# Patient Record
Sex: Female | Born: 1960
Health system: Southern US, Community
[De-identification: ages and names within clinical notes are randomized; demographics above are authoritative.]

## PROBLEM LIST (undated history)

## (undated) DIAGNOSIS — F419 Anxiety disorder, unspecified: Secondary | ICD-10-CM

## (undated) DIAGNOSIS — K219 Gastro-esophageal reflux disease without esophagitis: Secondary | ICD-10-CM

## (undated) DIAGNOSIS — K8020 Calculus of gallbladder without cholecystitis without obstruction: Secondary | ICD-10-CM

## (undated) DIAGNOSIS — T7840XA Allergy, unspecified, initial encounter: Secondary | ICD-10-CM

## (undated) DIAGNOSIS — J302 Other seasonal allergic rhinitis: Secondary | ICD-10-CM

## (undated) DIAGNOSIS — H3530 Unspecified macular degeneration: Secondary | ICD-10-CM

## (undated) DIAGNOSIS — C4491 Basal cell carcinoma of skin, unspecified: Secondary | ICD-10-CM

## (undated) DIAGNOSIS — E042 Nontoxic multinodular goiter: Secondary | ICD-10-CM

## (undated) HISTORY — DX: Basal cell carcinoma of skin, unspecified: C44.91

## (undated) HISTORY — DX: Anxiety disorder, unspecified: F41.9

## (undated) HISTORY — PX: DILATION AND CURETTAGE OF UTERUS: SHX78

## (undated) HISTORY — DX: Calculus of gallbladder without cholecystitis without obstruction: K80.20

## (undated) HISTORY — DX: Nontoxic multinodular goiter: E04.2

## (undated) HISTORY — DX: Gastro-esophageal reflux disease without esophagitis: K21.9

## (undated) HISTORY — PX: ESOPHAGOGASTRODUODENOSCOPY: SHX1529

## (undated) HISTORY — DX: Allergy, unspecified, initial encounter: T78.40XA

## (undated) HISTORY — DX: Unspecified macular degeneration: H35.30

## (undated) HISTORY — DX: Other seasonal allergic rhinitis: J30.2

---

## 1991-10-09 DIAGNOSIS — K8020 Calculus of gallbladder without cholecystitis without obstruction: Secondary | ICD-10-CM

## 1991-10-09 HISTORY — PX: CHOLECYSTECTOMY: SHX55

## 1991-10-09 HISTORY — DX: Calculus of gallbladder without cholecystitis without obstruction: K80.20

## 1994-10-08 HISTORY — PX: TUBAL LIGATION: SHX77

## 1999-02-13 ENCOUNTER — Other Ambulatory Visit: Admission: RE | Admit: 1999-02-13 | Discharge: 1999-02-13 | Payer: Self-pay | Admitting: Obstetrics & Gynecology

## 2000-02-12 ENCOUNTER — Encounter: Payer: Self-pay | Admitting: Internal Medicine

## 2000-02-12 ENCOUNTER — Ambulatory Visit (HOSPITAL_COMMUNITY): Admission: RE | Admit: 2000-02-12 | Discharge: 2000-02-12 | Payer: Self-pay | Admitting: Internal Medicine

## 2000-02-21 ENCOUNTER — Other Ambulatory Visit: Admission: RE | Admit: 2000-02-21 | Discharge: 2000-02-21 | Payer: Self-pay | Admitting: Obstetrics & Gynecology

## 2000-02-22 ENCOUNTER — Other Ambulatory Visit: Admission: RE | Admit: 2000-02-22 | Discharge: 2000-02-22 | Payer: Self-pay | Admitting: Obstetrics & Gynecology

## 2000-02-22 ENCOUNTER — Encounter (INDEPENDENT_AMBULATORY_CARE_PROVIDER_SITE_OTHER): Payer: Self-pay

## 2001-03-05 ENCOUNTER — Other Ambulatory Visit: Admission: RE | Admit: 2001-03-05 | Discharge: 2001-03-05 | Payer: Self-pay | Admitting: Obstetrics & Gynecology

## 2002-03-27 ENCOUNTER — Other Ambulatory Visit: Admission: RE | Admit: 2002-03-27 | Discharge: 2002-03-27 | Payer: Self-pay | Admitting: Obstetrics & Gynecology

## 2003-08-05 ENCOUNTER — Other Ambulatory Visit: Admission: RE | Admit: 2003-08-05 | Discharge: 2003-08-05 | Payer: Self-pay | Admitting: Obstetrics & Gynecology

## 2004-11-20 ENCOUNTER — Ambulatory Visit: Payer: Self-pay | Admitting: Family Medicine

## 2007-10-09 DIAGNOSIS — E042 Nontoxic multinodular goiter: Secondary | ICD-10-CM

## 2007-10-09 HISTORY — DX: Nontoxic multinodular goiter: E04.2

## 2008-02-18 ENCOUNTER — Encounter (INDEPENDENT_AMBULATORY_CARE_PROVIDER_SITE_OTHER): Payer: Self-pay | Admitting: *Deleted

## 2008-02-18 ENCOUNTER — Ambulatory Visit: Payer: Self-pay | Admitting: Internal Medicine

## 2008-02-18 DIAGNOSIS — J312 Chronic pharyngitis: Secondary | ICD-10-CM

## 2008-02-18 DIAGNOSIS — N926 Irregular menstruation, unspecified: Secondary | ICD-10-CM

## 2008-02-18 DIAGNOSIS — R1310 Dysphagia, unspecified: Secondary | ICD-10-CM | POA: Insufficient documentation

## 2008-02-18 DIAGNOSIS — J309 Allergic rhinitis, unspecified: Secondary | ICD-10-CM | POA: Insufficient documentation

## 2008-02-18 DIAGNOSIS — K219 Gastro-esophageal reflux disease without esophagitis: Secondary | ICD-10-CM

## 2008-02-18 DIAGNOSIS — R002 Palpitations: Secondary | ICD-10-CM | POA: Insufficient documentation

## 2008-02-18 LAB — CONVERTED CEMR LAB

## 2008-02-23 ENCOUNTER — Ambulatory Visit: Payer: Self-pay | Admitting: Internal Medicine

## 2008-02-23 LAB — CONVERTED CEMR LAB
Albumin: 4.1 g/dL (ref 3.5–5.2)
Alkaline Phosphatase: 55 units/L (ref 39–117)
BUN: 10 mg/dL (ref 6–23)
Basophils Relative: 3.2 % — ABNORMAL HIGH (ref 0.0–1.0)
Calcium: 9 mg/dL (ref 8.4–10.5)
Creatinine, Ser: 0.8 mg/dL (ref 0.4–1.2)
Eosinophils Relative: 3 % (ref 0.0–5.0)
Free T4: 0.9 ng/dL (ref 0.6–1.6)
GFR calc Af Amer: 100 mL/min
Glucose, Bld: 99 mg/dL (ref 70–99)
HCT: 40.3 % (ref 36.0–46.0)
Hemoglobin: 13.6 g/dL (ref 12.0–15.0)
LH: 26.3 milliintl units/mL
Monocytes Absolute: 0.5 10*3/uL (ref 0.1–1.0)
Monocytes Relative: 8.5 % (ref 3.0–12.0)
Neutro Abs: 3.7 10*3/uL (ref 1.4–7.7)
Total CHOL/HDL Ratio: 3.4
Total Protein: 7 g/dL (ref 6.0–8.3)
WBC: 5.9 10*3/uL (ref 4.5–10.5)

## 2008-02-25 ENCOUNTER — Encounter: Payer: Self-pay | Admitting: Internal Medicine

## 2008-03-02 ENCOUNTER — Ambulatory Visit (HOSPITAL_COMMUNITY): Admission: RE | Admit: 2008-03-02 | Discharge: 2008-03-02 | Payer: Self-pay | Admitting: Internal Medicine

## 2008-03-02 ENCOUNTER — Telehealth: Payer: Self-pay | Admitting: Internal Medicine

## 2008-03-02 DIAGNOSIS — E041 Nontoxic single thyroid nodule: Secondary | ICD-10-CM

## 2008-03-05 ENCOUNTER — Encounter (INDEPENDENT_AMBULATORY_CARE_PROVIDER_SITE_OTHER): Payer: Self-pay | Admitting: Interventional Radiology

## 2008-03-05 ENCOUNTER — Encounter: Payer: Self-pay | Admitting: Internal Medicine

## 2008-03-05 ENCOUNTER — Ambulatory Visit (HOSPITAL_COMMUNITY): Admission: RE | Admit: 2008-03-05 | Discharge: 2008-03-05 | Payer: Self-pay | Admitting: Internal Medicine

## 2008-03-15 ENCOUNTER — Telehealth: Payer: Self-pay | Admitting: Internal Medicine

## 2008-03-16 ENCOUNTER — Ambulatory Visit: Payer: Self-pay | Admitting: Internal Medicine

## 2008-03-16 ENCOUNTER — Telehealth: Payer: Self-pay | Admitting: Internal Medicine

## 2008-03-17 ENCOUNTER — Ambulatory Visit (HOSPITAL_COMMUNITY): Admission: RE | Admit: 2008-03-17 | Discharge: 2008-03-17 | Payer: Self-pay | Admitting: Internal Medicine

## 2008-03-25 ENCOUNTER — Encounter: Payer: Self-pay | Admitting: Internal Medicine

## 2008-03-25 ENCOUNTER — Ambulatory Visit (HOSPITAL_COMMUNITY): Admission: RE | Admit: 2008-03-25 | Discharge: 2008-03-25 | Payer: Self-pay | Admitting: Internal Medicine

## 2008-03-25 ENCOUNTER — Ambulatory Visit: Payer: Self-pay | Admitting: Internal Medicine

## 2008-04-01 ENCOUNTER — Ambulatory Visit: Payer: Self-pay | Admitting: Internal Medicine

## 2008-04-08 ENCOUNTER — Telehealth: Payer: Self-pay | Admitting: Internal Medicine

## 2008-05-21 ENCOUNTER — Encounter (INDEPENDENT_AMBULATORY_CARE_PROVIDER_SITE_OTHER): Payer: Self-pay | Admitting: Obstetrics & Gynecology

## 2008-05-21 ENCOUNTER — Ambulatory Visit (HOSPITAL_COMMUNITY): Admission: RE | Admit: 2008-05-21 | Discharge: 2008-05-21 | Payer: Self-pay | Admitting: Obstetrics & Gynecology

## 2008-05-26 ENCOUNTER — Encounter: Payer: Self-pay | Admitting: Internal Medicine

## 2009-03-29 ENCOUNTER — Telehealth: Payer: Self-pay | Admitting: Internal Medicine

## 2009-04-06 ENCOUNTER — Ambulatory Visit: Payer: Self-pay | Admitting: Diagnostic Radiology

## 2009-04-06 ENCOUNTER — Ambulatory Visit (HOSPITAL_BASED_OUTPATIENT_CLINIC_OR_DEPARTMENT_OTHER): Admission: RE | Admit: 2009-04-06 | Discharge: 2009-04-06 | Payer: Self-pay | Admitting: Internal Medicine

## 2009-04-06 ENCOUNTER — Ambulatory Visit: Payer: Self-pay | Admitting: Internal Medicine

## 2009-04-07 ENCOUNTER — Telehealth: Payer: Self-pay | Admitting: Internal Medicine

## 2009-07-06 IMAGING — US US BIOPSY
1 series · 7 of 7 positions shown · non-contrast
Comparison: none

CLINICAL DATA: Multinodular thyroid with dominant complex lesion
in the lower pole on the right.

ULTRASOUND-GUIDED THYROID ASPIRATION BIOPSY
TECHNIQUE: Survey ultrasound was performed and the dominant lesion
in the right inferior lobe was localized.  An appropriate skin
entry site was determined.  Skin was marked, then prepped with
Betadine, draped in usual sterile fashion, and infiltrated locally
with 1% lidocaine.  Under real-time ultrasound guidance, 4  passes
were made into the lesion with 25 gauge needles for aspiration
biopsy.  The patient tolerated procedure well, with no immediate
complications.
IMPRESSION
1.  Technically successful ultrasound-guided thyroid aspiration
biopsy

[Series 1: thyroid · 0.07mm/px · 7 of 7 slices shown]
[im 1/7]
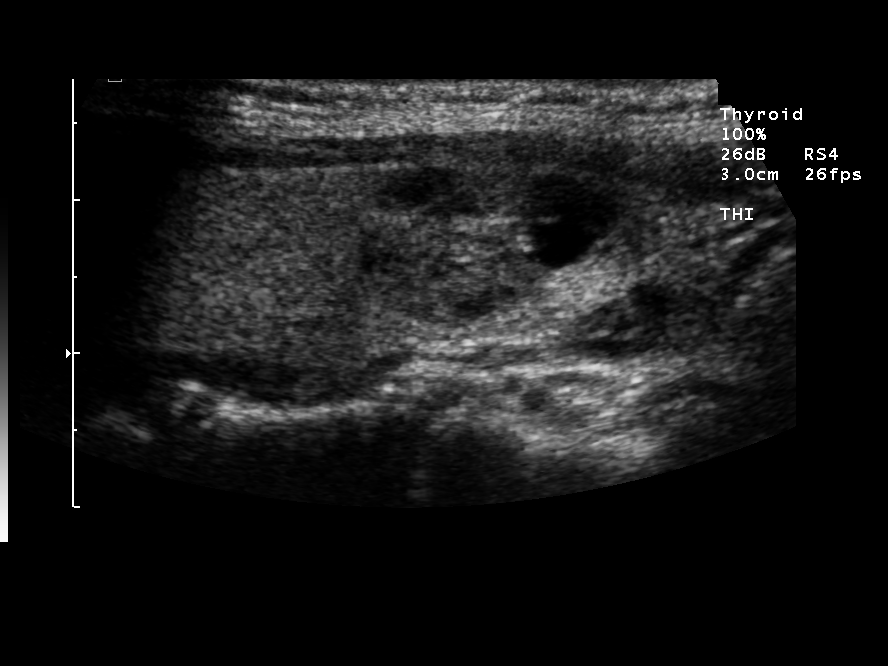
[im 2/7]
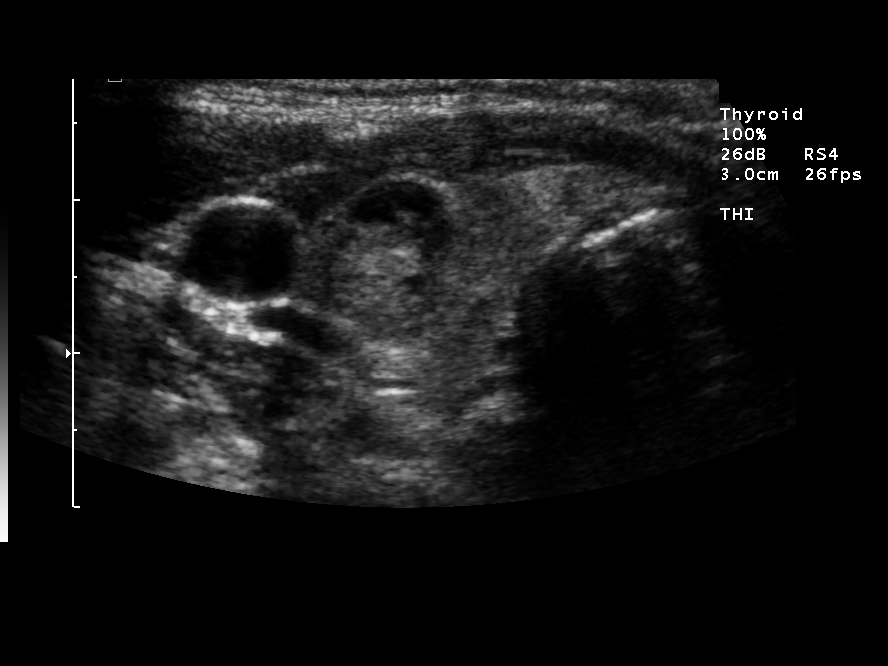
[im 3/7]
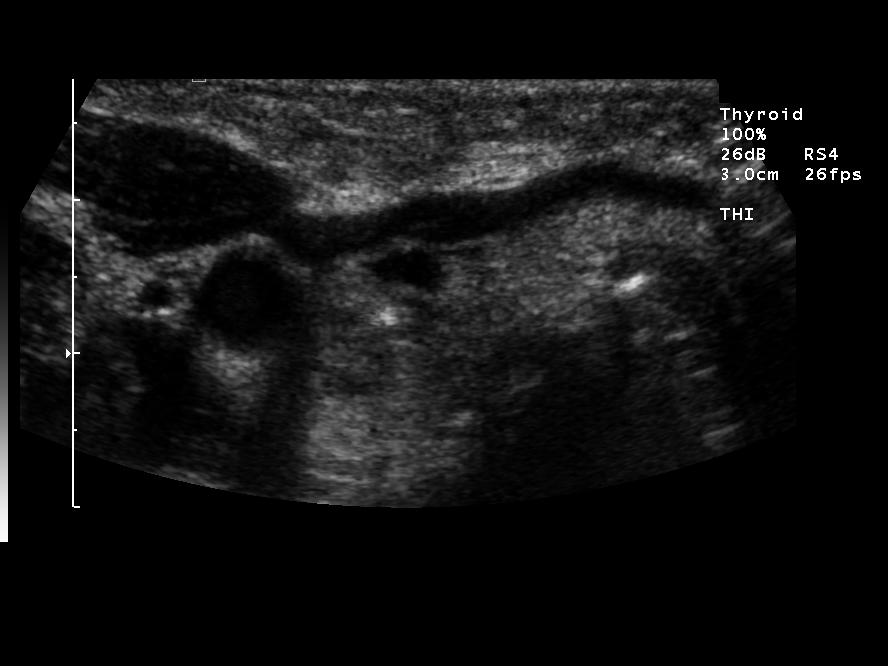
[im 4/7]
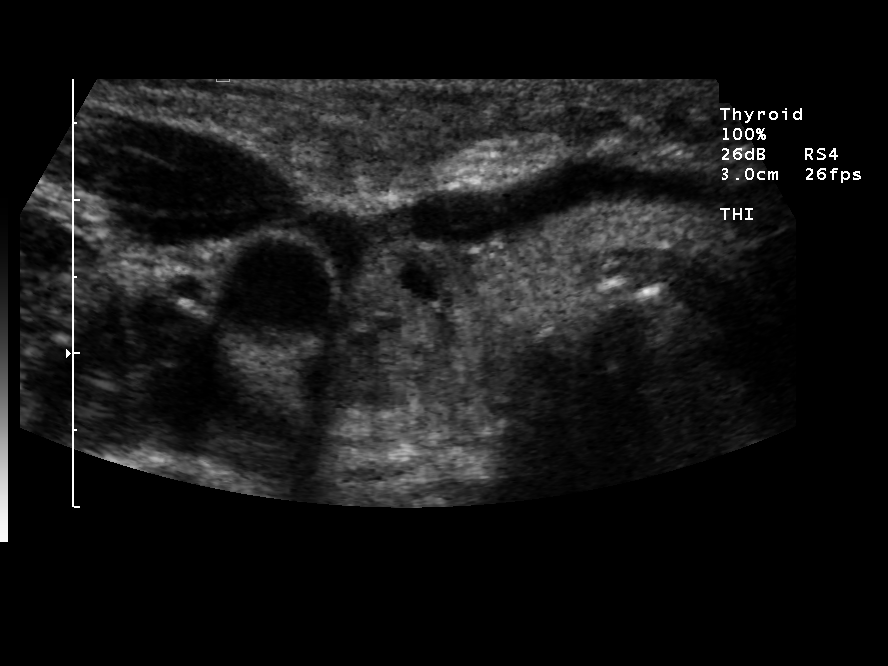
[im 5/7]
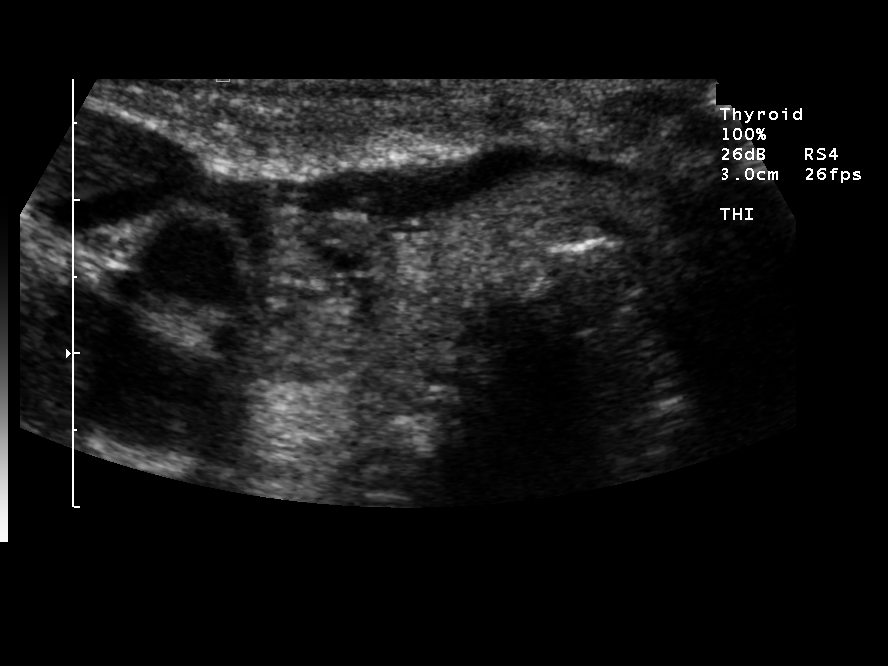
[im 6/7]
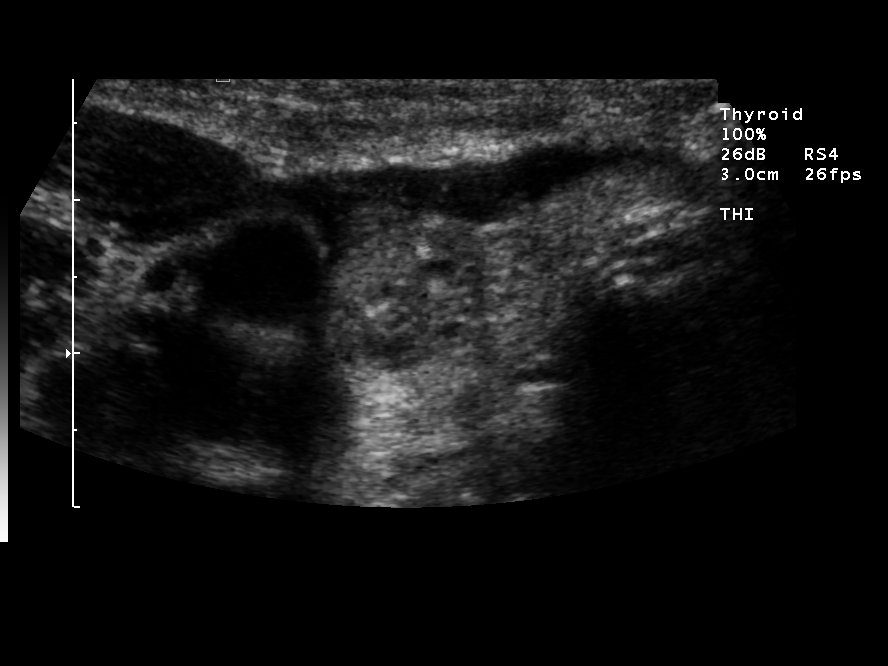
[im 7/7]
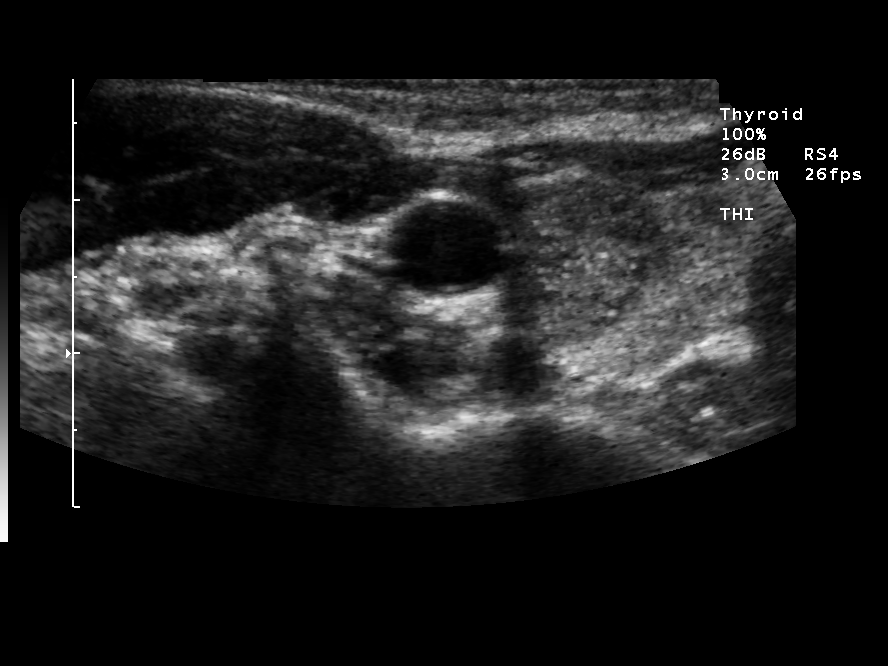

[7 of 7 positions shown; findings below may reference images not displayed]

## 2009-07-18 IMAGING — RF DG ESOPHAGUS
3 series · 20 of 24 positions shown · non-contrast
Comparison: None

CLINICAL DATA: Dysphasia for 4 months.

BARIUM SWALLOW / ESOPHAGRAM

[Series 1: run · 1 of 1 slices shown (1 of 3)]
[im 1/1]
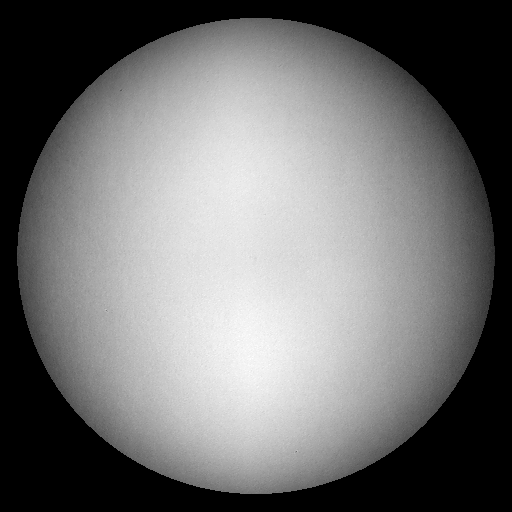

[Series 2: run · 11 of 29 slices shown (2 of 3)]
[im 1/29]
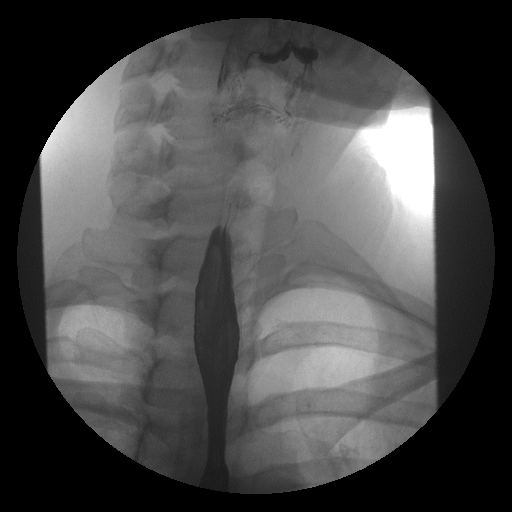
[im 5/29]
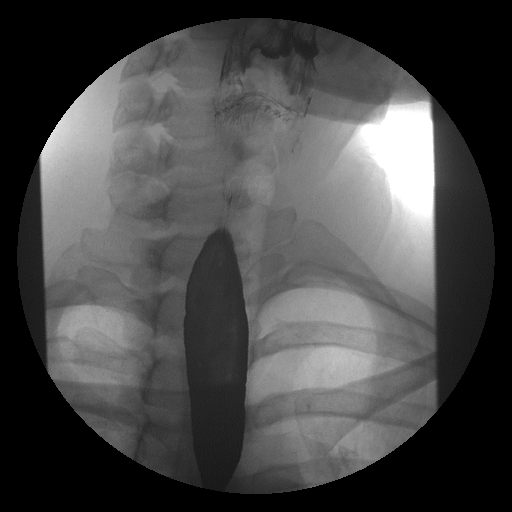
[im 8/29]
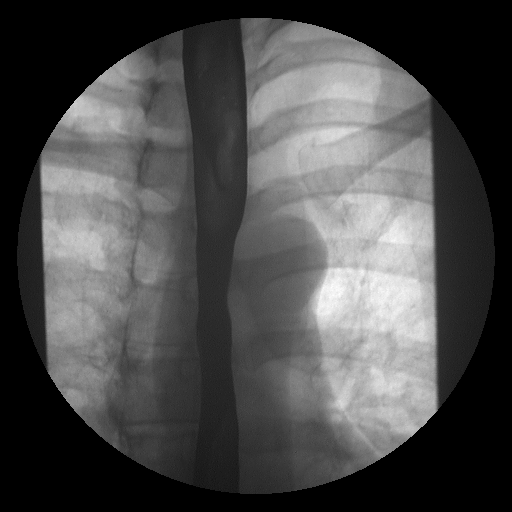
[im 10/29]
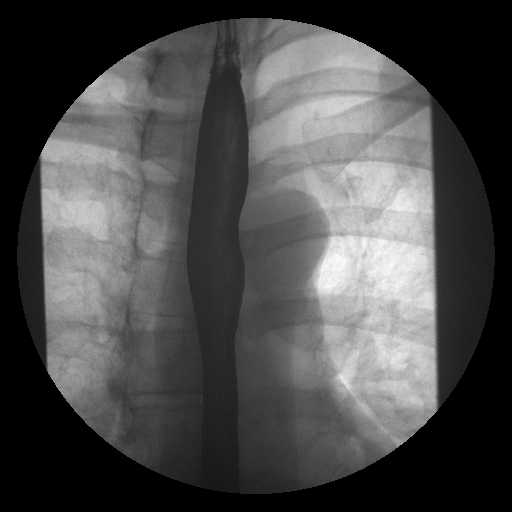
[im 12/29]
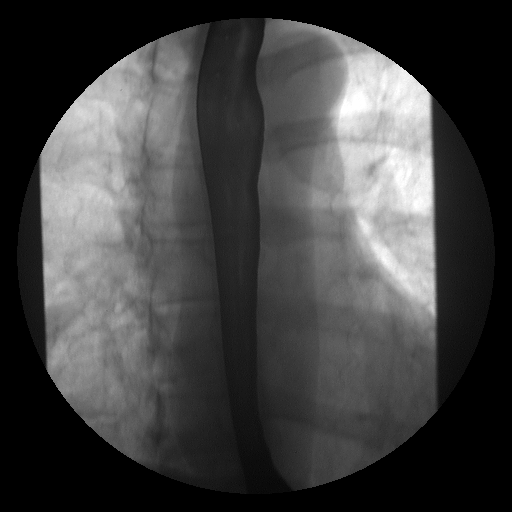
[im 15/29]
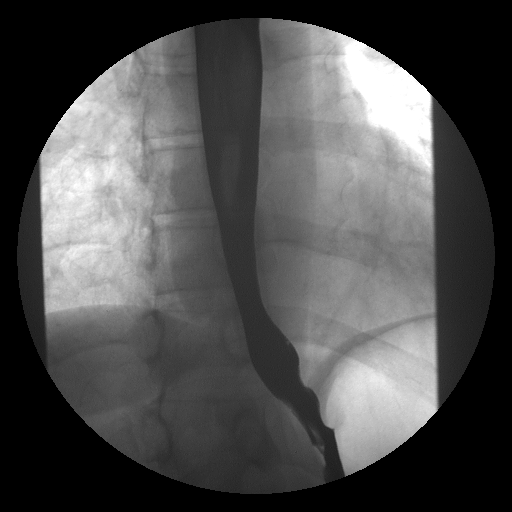
[im 19/29]
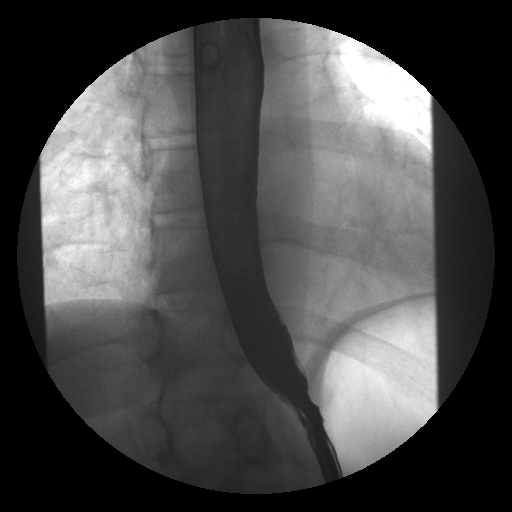
[im 22/29]
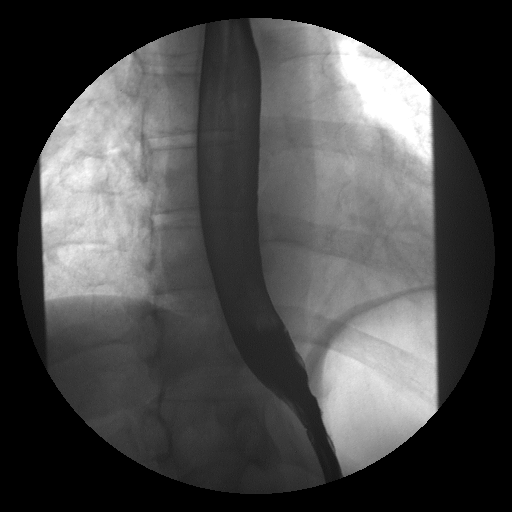
[im 24/29]
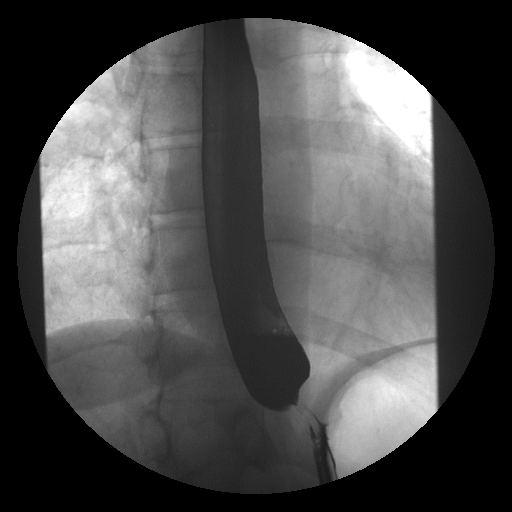
[im 26/29]
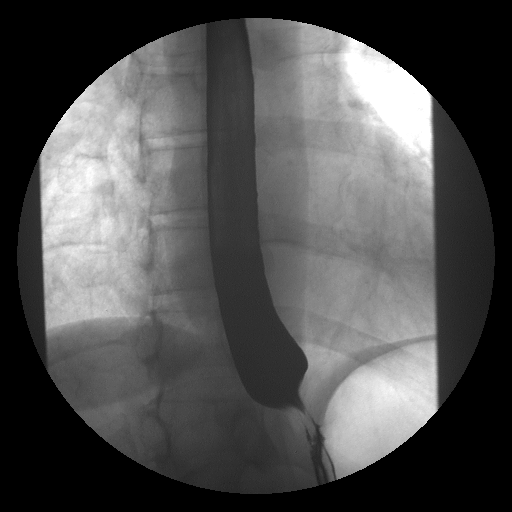
[im 29/29]
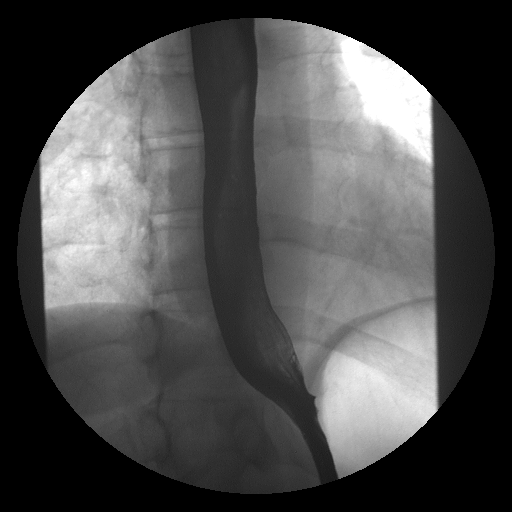

[Series 3: run · 8 of 22 slices shown (3 of 3)]
[im 3/22]
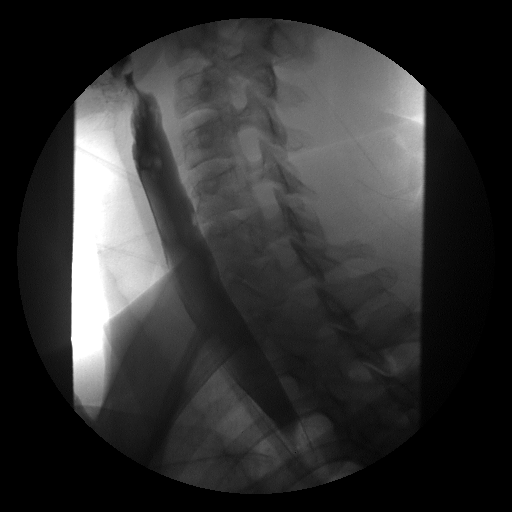
[im 5/22]
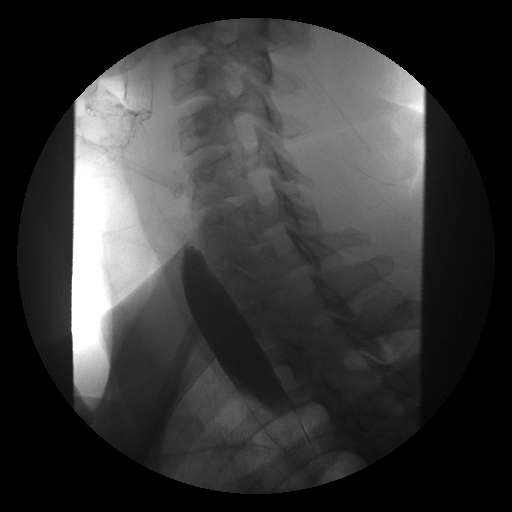
[im 8/22]
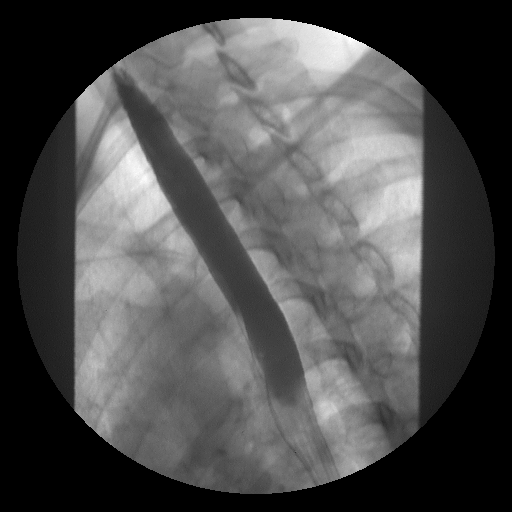
[im 10/22]
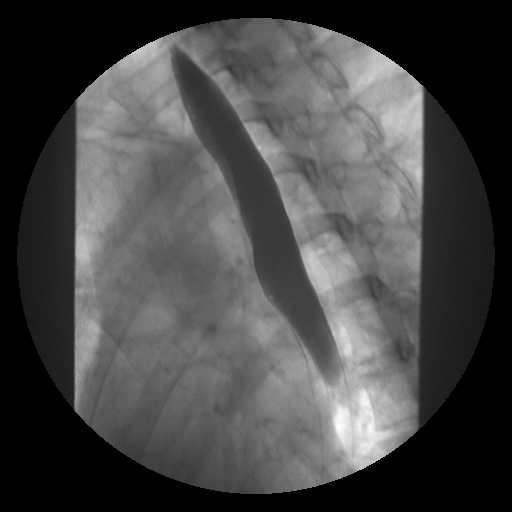
[im 12/22]
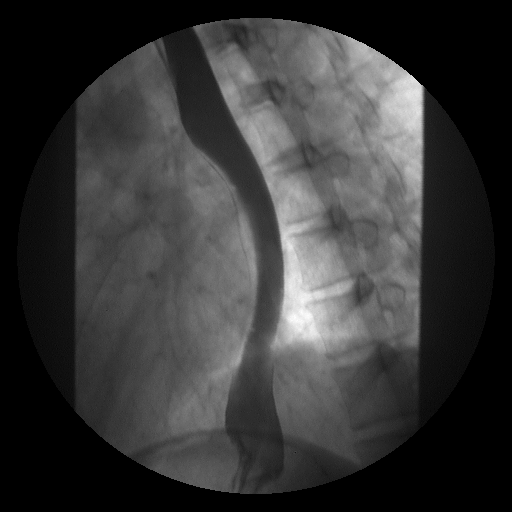
[im 17/22]
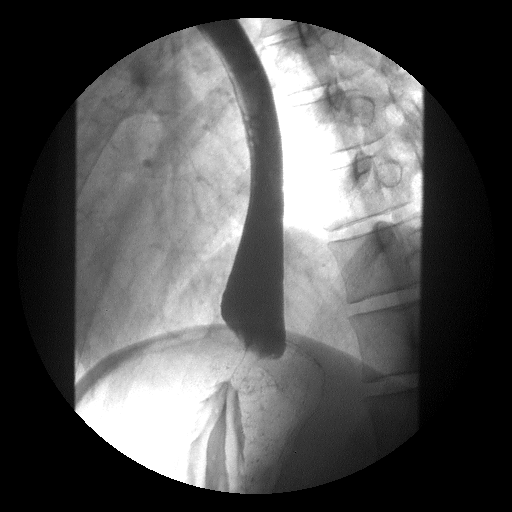
[im 19/22]
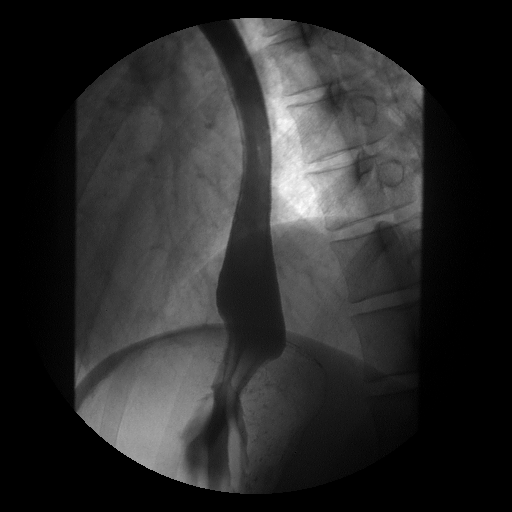
[im 22/22]
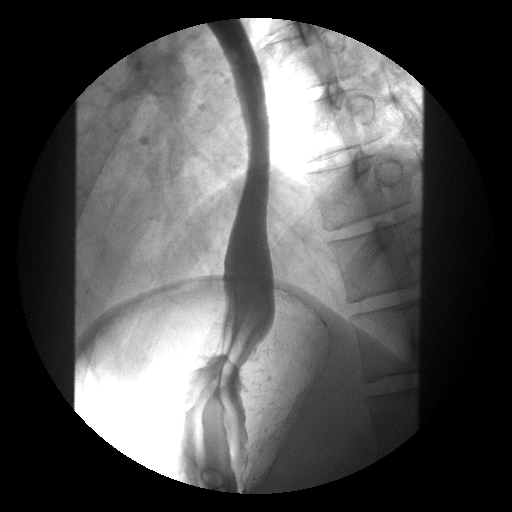

[20 of 24 positions shown; findings below may reference images not displayed]

FINDINGS: The esophageal mucosa and motility are normal.  There is
no stricture or mass.  Barium tablet passed readily into the
stomach.  There is no hiatal hernia or reflux demonstrated.
IMPRESSION: Negative

## 2009-10-08 DIAGNOSIS — C4491 Basal cell carcinoma of skin, unspecified: Secondary | ICD-10-CM

## 2009-10-08 HISTORY — DX: Basal cell carcinoma of skin, unspecified: C44.91

## 2009-10-12 ENCOUNTER — Ambulatory Visit: Payer: Self-pay | Admitting: Family

## 2009-10-12 ENCOUNTER — Ambulatory Visit: Payer: Self-pay | Admitting: Diagnostic Radiology

## 2009-10-12 ENCOUNTER — Telehealth: Payer: Self-pay | Admitting: Family

## 2009-10-12 ENCOUNTER — Ambulatory Visit (HOSPITAL_BASED_OUTPATIENT_CLINIC_OR_DEPARTMENT_OTHER): Admission: RE | Admit: 2009-10-12 | Discharge: 2009-10-12 | Payer: Self-pay | Admitting: Internal Medicine

## 2009-10-12 LAB — CONVERTED CEMR LAB
AST: 18 units/L (ref 0–37)
Albumin: 4.8 g/dL (ref 3.5–5.2)
Alkaline Phosphatase: 64 units/L (ref 39–117)
Total Bilirubin: 0.4 mg/dL (ref 0.3–1.2)

## 2009-10-13 ENCOUNTER — Encounter: Payer: Self-pay | Admitting: Family

## 2009-10-14 ENCOUNTER — Ambulatory Visit: Payer: Self-pay | Admitting: Cardiology

## 2009-10-14 ENCOUNTER — Telehealth: Payer: Self-pay | Admitting: Family

## 2009-10-31 LAB — HM MAMMOGRAPHY: HM Mammogram: NORMAL

## 2010-02-21 ENCOUNTER — Encounter: Payer: Self-pay | Admitting: Internal Medicine

## 2010-02-21 LAB — CONVERTED CEMR LAB: Pap Smear: NORMAL

## 2010-04-06 ENCOUNTER — Telehealth: Payer: Self-pay | Admitting: Internal Medicine

## 2010-04-07 HISTORY — PX: THYROID SURGERY: SHX805

## 2010-11-07 NOTE — Progress Notes (Signed)
Summary: Nasacort refill  Phone Note Refill Request Call back at Home Phone [phone] Message from:  Patient on April 06, 2010 10:55 AM  Refills Requested: Medication #1:  NASACORT AQ 55 MCG/ACT  AERS one sprayeach nostril two times a day as needed. Initial call taken by: Lannette Donath,  April 06, 2010 10:56 AM  Follow-up for Phone Call        ok to refill x 5 Follow-up by: D. Thomos Lemons DO,  April 06, 2010 1:52 PM  Additional Follow-up for Phone Call Additional follow up Details #1::        Refill completed. Left message on pt's voicemail to return my call.  Mervin Kung CMA  April 06, 2010 3:10 PM   Pt returned my call and was notified refill complete.  Nicki Guadalajara Fergerson CMA  April 06, 2010 4:25 PM     Prescriptions: NASACORT AQ 55 MCG/ACT  AERS (TRIAMCINOLONE ACETONIDE(NASAL)) one sprayeach nostril two times a day as needed  #1 x 5   Entered by:   Mervin Kung CMA   Authorized by:   D. Thomos Lemons DO   Signed by:   Mervin Kung CMA on 04/06/2010   Method used:   Electronically to        [facility]* (retail)       [address]       [address]       [place], [place]  [address]       Ph: [phone]       Fax: [phone]   RxID:   [id number]

## 2010-11-07 NOTE — Assessment & Plan Note (Signed)
Summary: R UPPER QUAD PAIN/HEA   Vital Signs:  Patient profile:   50 year old female Weight:      143.75 pounds BMI:     24.76 O2 Sat:      99 % on Room air Temp:     98.0 degrees F oral Pulse rate:   90 / minute Pulse rhythm:   regular Resp:     16 per minute BP sitting:   112 / 72  (right arm) Cuff size:   regular  Vitals Entered By: Glendell Docker CMA (October 12, 2009 2:21 PM)  O2 Flow:  Room air  Primary Care Provider:  Thomos Lemons, M.D.  CC:  left upper quad pain.  History of Present Illness: c/o sharp pain just at the edge of rib cageon the left side started in [place], but still is bothersome has not resolved.  This pain is worse with cough.  Not exacerbated by eating.  Denies injury or fall.  Allergies (verified): No Known Drug Allergies  Review of Systems       occasional dry cough.  Denies nausea or vomitting. Denies fever  Physical Exam  General:  Well-developed,well-nourished,in no acute distress; alert,appropriate and cooperative throughout examination Chest Wall:  no reproducible tenderness to palpation overlying left anterior rib Lungs:  Normal respiratory effort, chest expands symmetrically. Lungs are clear to auscultation, no crackles or wheezes. Heart:  Normal rate and regular rhythm. S1 and S2 normal without gallop, murmur, click, rub or other extra sounds. Abdomen:  Bowel sounds positive,abdomen soft and non-tender without masses, organomegaly or hernias noted.   Impression & Recommendations:  Problem # 1:  RIB PAIN, LEFT SIDED (ICD-786.50) LFT's, amylase/lipase WNL.  Chest/rib x-ray- no fracture, no pneumonia.  RUL ? nodule vs artifact.  Radiology recs comparison to old CXR (patient never had cxr in the past) vs CT to r/o pulmonary nodule.  Spoke with patient will order CT.  I suspect however that patient's pain is due to musculoskeletal pain and I have recommended tylenol or motrin as needed. Orders: T-DG Ribs Bilateral w/Chest  ([id number]) T-Hepatic Function [id number]) T-Lipase [id number]) T-Amylase [id number]) D-Dimer- [facility] ([id number])  Complete Medication List: 1)  Nasacort Aq 55 Mcg/act Aers (Triamcinolone acetonide(nasal)) .... One sprayeach nostril two times a day as needed  Patient Instructions: 1)  Please complete Chest x-ray, and lab work this afternoon.   2)  I will call you with the chest x-ray results Prescriptions: NASACORT AQ 55 MCG/ACT  AERS (TRIAMCINOLONE ACETONIDE(NASAL)) one sprayeach nostril two times a day as needed  #1 x 6   Entered by:   Glendell Docker CMA   Authorized by:   Lemont Fillers FNP   Signed by:   Lemont Fillers FNP on 10/12/2009   Method used:   Electronically to        [facility]* (retail)       [address]       [address]mail[address]       [place], [place]  [address]       Ph: [phone]       Fax: [phone]   RxID:   [id number]     Contraindications/Deferment of Procedures/Staging:    Test/Procedure: FLU VAX    Reason for deferment: patient declined      Current Allergies (reviewed today): No known allergies

## 2010-11-07 NOTE — Letter (Signed)
   [place] [facility] [address] [place], [place]  [address]  [place] Phone: [phone]      October 13, 2009   Melannie Sibley [address] [place], [place] [address]  RE:  LAB RESULTS  Dear  Ms. Chaisson,  The following is an interpretation of your most recent lab tests.  Please take note of any instructions provided or changes to medications that have resulted from your lab work.  LIVER FUNCTION TESTS:  Good - no changes needed   amylase/lipase/d dimer- all normal.   Sincerely Yours,    Lemont Fillers FNP

## 2010-11-07 NOTE — Progress Notes (Signed)
  Phone Note Outgoing Call   Call placed by: Lemont Fillers FNP,  October 14, 2009 5:23 PM Summary of Call: Called patient- reviewed results of CT chest (negative for pulm nodule).  Patient notes last mammo a little over 1 year ago.  Noted "asymmetric breast tissue" on CT.  Recommend to patien that she schedule her screening mammogram.  She verbalized understanding.   Initial call taken by: Lemont Fillers FNP,  October 14, 2009 5:25 PM

## 2010-11-07 NOTE — Progress Notes (Signed)
  Phone Note Outgoing Call   Call placed by: Lemont Fillers FNP,  October 12, 2009 5:59 PM Summary of Call: called patient- reviewed x-ray results, ?RUL pulmonary nodule vs artifact.  Radiology is recommending comparison to old X-ray (pt tells me no x-ray history) or CT chest.  Will check non-contrasted CT to further evaluate Initial call taken by: Lemont Fillers FNP,  October 12, 2009 6:00 PM  New Problems: ABNORMAL CHEST XRAY (ICD-793.1)   New Problems: ABNORMAL CHEST XRAY (ICD-793.1)

## 2010-11-09 ENCOUNTER — Telehealth: Payer: Self-pay | Admitting: Internal Medicine

## 2010-11-10 ENCOUNTER — Other Ambulatory Visit: Payer: Self-pay | Admitting: Internal Medicine

## 2010-11-10 DIAGNOSIS — E049 Nontoxic goiter, unspecified: Secondary | ICD-10-CM

## 2010-11-14 ENCOUNTER — Encounter: Payer: Self-pay | Admitting: Internal Medicine

## 2010-11-14 ENCOUNTER — Ambulatory Visit (HOSPITAL_BASED_OUTPATIENT_CLINIC_OR_DEPARTMENT_OTHER)
Admission: RE | Admit: 2010-11-14 | Discharge: 2010-11-14 | Disposition: A | Discharge status: Home / Self Care | Payer: Commercial Managed Care - PPO | Source: Ambulatory Visit | Attending: Internal Medicine | Admitting: Internal Medicine

## 2010-11-14 DIAGNOSIS — E049 Nontoxic goiter, unspecified: Secondary | ICD-10-CM

## 2010-11-14 DIAGNOSIS — Z09 Encounter for follow-up examination after completed treatment for conditions other than malignant neoplasm: Secondary | ICD-10-CM | POA: Insufficient documentation

## 2010-11-14 DIAGNOSIS — E042 Nontoxic multinodular goiter: Secondary | ICD-10-CM | POA: Insufficient documentation

## 2010-11-14 LAB — CONVERTED CEMR LAB
BUN: 14 mg/dL (ref 6–23)
Basophils Relative: 1 % (ref 0–1)
Calcium: 8.8 mg/dL (ref 8.4–10.5)
Cholesterol: 185 mg/dL (ref 0–200)
Creatinine, Ser: 0.82 mg/dL (ref 0.40–1.20)
Eosinophils Absolute: 0.1 10*3/uL (ref 0.0–0.7)
Eosinophils Relative: 3 % (ref 0–5)
MCHC: 31.8 g/dL (ref 30.0–36.0)
MCV: 88.5 fL (ref 78.0–100.0)
Monocytes Absolute: 0.5 10*3/uL (ref 0.1–1.0)
Monocytes Relative: 10 % (ref 3–12)
Neutrophils Relative %: 57 % (ref 43–77)
Potassium: 4.5 meq/L (ref 3.5–5.3)
RBC: 4.94 M/uL (ref 3.87–5.11)
Triglycerides: 58 mg/dL (ref <150)

## 2010-11-15 NOTE — Progress Notes (Signed)
Summary: lab work for [employer] Note Call from Patient   Caller: Patient Call For: darlene Summary of Call: pt has an appt on 02.10.12 at 11:00 for a phy. She wants to have her lab work done 3-7 days before at the [place] location. It does not look like she had lab work done in a year. Please assist. Initial call taken by: Elba Barman,  November 09, 2010 10:05 AM  Follow-up for Phone Call        BMP prior to visit, ICD-9: 241.0 Lipid Panel prior to visit, ICD-9:  v70 TSH, Free T4 prior to visit, ICD-9: 241.0 CBC - 530.81  pt should also have thyroid u/s done before OV Follow-up by: D. Thomos Lemons DO,  November 09, 2010 3:01 PM  Additional Follow-up for Phone Call Additional follow up Details #1::        call placed to patient at [phone], no answer. A detailed voice message was left informing patient of blood work and Thyroid Ultrasound. Message was left for patient to return call.  Additional Follow-up by: Glendell Docker CMA,  November 09, 2010 4:03 PM

## 2010-11-17 ENCOUNTER — Encounter: Payer: Self-pay | Admitting: Internal Medicine

## 2010-11-17 ENCOUNTER — Encounter (INDEPENDENT_AMBULATORY_CARE_PROVIDER_SITE_OTHER): Payer: Commercial Managed Care - PPO | Admitting: Internal Medicine

## 2010-11-17 DIAGNOSIS — Z0000 Encounter for general adult medical examination without abnormal findings: Secondary | ICD-10-CM

## 2010-11-23 NOTE — Miscellaneous (Signed)
Summary: Orders Update  Clinical Lists Changes  Orders: Added new Test order of T-Basic Metabolic Panel [id number]) - Signed Added new Test order of T-Lipid Profile [phone]) - Signed Added new Test order of T-TSH [id number]) - Signed Added new Test order of T-T4, Free [phone]) - Signed Added new Test order of T-CBC w/Diff ([id number]) - Signed

## 2010-12-05 NOTE — Assessment & Plan Note (Signed)
Summary: phy/ss   Vital Signs:  Patient profile:   50 year old female Height:      64 inches Weight:      145.75 pounds BMI:     25.11 O2 Sat:      100 % on Room air Temp:     98.0 degrees F oral Pulse rate:   66 / minute Resp:     18 per minute BP sitting:   108 / 70  (right arm) Cuff size:   regular  Vitals Entered By: Glendell Docker CMA (November 17, 2010 11:06 AM)  O2 Flow:  Room air CC: CPX Is Patient Diabetic? No Pain Assessment Patient in pain? no      Comments refill on Nasacort   Primary Care Provider:  Dondra Spry DO  CC:  CPX.  History of Present Illness: 50 y/o female for routine cpx she eats healthy most of time wt is stable she returned from vacation to [place] more walking that usual c/o left heel pain  no regular walking or jogging but in exercise "boot camp" class  wearing supportive tennis shoes at home  Int Med Hx  normal pap 11/2009  04/2010  1. SKIN, RIGHT SHOULDER, SHAVE : SUPERFICIAL BASAL CELL CARCINOMA she is followed by derm using spf 30  due for mammo this year  reviewed prev EGD 2009 Comments: 1) ? OF SHORT-SEGMENT BARRETT'S VS IRREGULAR Z-LINE/HIATAL HERNIA EFFECT. BIOPSIES TAKEN 2) 1-2 CM SLIDING HIATAL HERNIA 3) OTHERWISE NORMAL 4) 48 FR MALONEY DILATION PERFORMED GIVEN HX OF IMPACT DYSPHAGIA  ESOPHAGUS, BIOPSIES:  GASTROESOPHAGEAL JUNCTION MUCOSA WITH MILD INFLAMMATION CONSISTENT WITH GASTROESOPHAGEAL REFLUX.  NO INTESTINAL METAPLASIA, DYSPLASIA OR MALIGNANCY IDENTIFIED.    Preventive Screening-Counseling & Management  Alcohol-Tobacco     Alcohol drinks/day: <1     Smoking Status: quit  Caffeine-Diet-Exercise     Caffeine use/day: 3 beverages daily     Does Patient Exercise: yes     Times/week: 3  Allergies (verified): No Known Drug Allergies  Past History:  Past Medical History: Right Knee pain secondary to patellofemoral syndrome (Dr. Penni Bombard) GERD - EGD negative for Barretts  2009 Allergic  Rhinitis (year round) Multinodular Goiter (right thyroid nodule) - neg FNA 2009 Superficial basal cell carcinoma - right shoulder 04/2010     Past Surgical History: Cholecystectomy 1993 Tubal ligation 1996  FNA of right thyroid nodule 02/2008 (non neoplastic goiter)  Social History: Caffeine use/day:  3 beverages daily Does Patient Exercise:  yes  Review of Systems  The patient denies weight gain, chest pain, dyspnea on exertion, abdominal pain, melena, and hematochezia.    Physical Exam  General:  alert, well-developed, and well-nourished.   Head:  normocephalic and atraumatic.   Eyes:  pupils equal, pupils round, and pupils reactive to light.   Ears:  R ear normal and L ear normal.   Mouth:  good dentition and pharynx pink and moist.   Neck:  No deformities, masses, or tenderness noted. Lungs:  normal respiratory effort, normal breath sounds, no crackles, and no wheezes.   Heart:  normal rate, regular rhythm, no murmur, and no gallop.   Abdomen:  soft, non-tender, normal bowel sounds, no masses, no hepatomegaly, and no splenomegaly.   Msk:  left heel tenderness Extremities:  No lower extremity edema  Neurologic:  cranial nerves II-XII intact and gait normal.   Psych:  normally interactive, good eye contact, not anxious appearing, and not depressed appearing.     Impression & Recommendations:  Problem #  1:  HEALTH MAINTENANCE EXAM (ICD-V70.0)  Reviewed adult health maintenance protocols. pt urged to use SPF 50-60 - routine surveillance as per Dermotology defer to GYN re: q 2 yr PAPs mammo pending routine colon ca screening at age 76 up to date with adult vaccine I suggested pt add vit D 1000 units daily she is not menopausal.   consider baseline DEXA within 3-5 yrs  Mammogram: normal (10/31/2009) Pap smear: normal (02/21/2010) Td Booster: given (02/15/2003)   Flu Vax: Historical (07/18/2010)   Chol: 185 (11/14/2010)   HDL: 65 (11/14/2010)   LDL: 108 (11/14/2010)    TG: 58 (11/14/2010) TSH: 1.654 (11/14/2010)     Orders: EKG w/ Interpretation (93000)  Problem # 2:  THYROID NODULE (ICD-241.0) prev FNA showed  2009 : low risk for thyroid cancer surveillance u/s in 2014  COMMENT   The specimen contains small to medium sized sheets of banal   appearing follicular epithelial cells admixed with pigment-laden   macrophages and other chronic inflammatory cells. Cytologic   features of papillary thyroid carcinoma are not identified.   (JK:mw, 03/08/08)    f/u thyroid u/s  11/2010  Focal nodules:  The dominant nodule in the mid right lobe measures 2.0 x 1.1 x 0.9 cm and is complex with some calcifications.  This has been previously biopsied.  It is unchanged since the prior exam.   Multiple other small nodules in both lobes are essentially unchanged since the prior exam.  Problem # 3:  ALLERGIC RHINITIS (ICD-477.9) Assessment: Improved continue nasacort as needed  Her updated medication list for this problem includes:    Nasacort Aq 55 Mcg/act Aers (Triamcinolone acetonide(nasal)) ..... One sprayeach nostril two times a day as needed  Complete Medication List: 1)  Nasacort Aq 55 Mcg/act Aers (Triamcinolone acetonide(nasal)) .... One sprayeach nostril two times a day as needed  Patient Instructions: 1)  Please schedule a follow-up appointment as needed. Prescriptions: NASACORT AQ 55 MCG/ACT  AERS (TRIAMCINOLONE ACETONIDE(NASAL)) one sprayeach nostril two times a day as needed  #1 x 11   Entered and Authorized by:   D. Thomos Lemons DO   Signed by:   D. Thomos Lemons DO on 11/17/2010   Method used:   Electronically to        [facility]* (retail)       [address]       [address]/mail[address]       [place], [place]  [address]       Ph: [phone]       Fax: [phone]   RxID:   [id number]    Orders Added: 1)  EKG w/ Interpretation [93000] 2)  Est. Patient age 41-64 [[address]]   Immunization  History:  Influenza Immunization History:    Influenza:  historical (07/18/2010)   Immunization History:  Influenza Immunization History:    Influenza:  Historical (07/18/2010)  Current Allergies (reviewed today): No known allergies    Preventive Care Screening  Pap Smear:    Date:  02/21/2010    Results:  normal   Mammogram:    Date:  10/31/2009    Results:  normal

## 2011-02-20 NOTE — Op Note (Signed)
NAMEZEVA, LEBER            ACCOUNT NO.:  [id number]   MEDICAL RECORD NO.:  [id number]          PATIENT TYPE:  AMB   LOCATION:  [facility]                           FACILITY:  [facility]   PHYSICIAN:  Genia Del, M.D.DATE OF BIRTH:  Oct 03, 1961   DATE OF PROCEDURE:  05/21/2008  DATE OF DISCHARGE:                               OPERATIVE REPORT   PREOP DIAGNOSES:  Intrauterine lesions, probable endometrial polyps.   POSTOP DIAGNOSES:  Intrauterine lesions, probable endometrial polyps  plus posterior vaginal fornix lesion, probable endometriosis.   PROCEDURES:  Hysteroscopy, resection, dilatation and curettage, and  resection of posterior vaginal fornix lesion.   SURGEON:  Genia Del, MD   ANESTHESIOLOGIST:  Dr. Arby Barrette   PROCEDURE:  Under general anesthesia with laryngeal mask, the patient is  in lithotomy position.  She is prepped with Betadine on the suprapubic  area, vulvar and vaginal areas, and draped as usual.  The bladder is  catheterized.  The vaginal exam under general anesthesia reveals an  anteverted uterus, normal volume, no adnexal mass.  The speculum is  introduced in the vagina.  We noted a large posterior vaginal fornix  lesion compatible with endometriosis.  It measures about 2 x 3 cm.  We  grasped the anterior lip of the cervix with the tenaculum.  We make a  paracervical block with Nesacaine 1%, 20 mL total at 4 and 8 o'clock.  We then dilate the cervix with Hegar dilators progressively up to Hegar  dilator #33 without difficulty.  We then insert the hysteroscope in the  intrauterine cavity.  We inspect the intrauterine cavity and locate the  ostia.  We take pictures of the ostia and the intrauterine cavity.  We  note 2 small endometrial polyps.  Those are resected with the operative  hysteroscope with 1 loop instrument.  We then proceed with the  endometrial curettage and with the sharp curette, and sent all the  specimens to Pathology together.  We then  go back with a hysteroscope  and note good hemostasis.  A normal intrauterine cavity.  No lesion.  We  therefore remove the instruments.  We then proceed with the resection of  the posterior vaginal fornix lesion.  We use a scalpel to resect the  lesion completely and finish resection with the Mayo scissors.  The  specimen is sent to pathology separately.  We then complete hemostasis  with the electrocautery and close the vagina with Vicryl 0 on a Euro  needle.  Figure-of-eights are done to obtain good hemostasis.  We then  remove all instruments.  The count of instruments and sponges is  complete.  The estimated blood loss was 200 mL.  The fluid deficit was  30 mL.  No complications occurred, and the patient was brought to  recovery room in good stable status.      Genia Del, M.D.  Electronically Signed     ML/MEDQ  D:  05/21/2008  T:  05/22/2008  Job:  [id number]

## 2011-03-01 ENCOUNTER — Telehealth: Payer: Self-pay | Admitting: Cardiology

## 2011-03-01 NOTE — Telephone Encounter (Signed)
Gabrielle Hernandez called to inquire about the proper procedure for requesting records for herself and another physician. After explaining the process I emailed her a ROI. She will fax it back requesting the copies of her CT.

## 2011-03-09 ENCOUNTER — Telehealth: Payer: Self-pay | Admitting: Family

## 2011-03-09 DIAGNOSIS — R928 Other abnormal and inconclusive findings on diagnostic imaging of breast: Secondary | ICD-10-CM

## 2011-03-09 NOTE — Telephone Encounter (Signed)
Received phone call from pt's husband- Per husband patient's GYN Verita Schneiders is requesting CT to follow up on the breast abnormality seen on CT back in January 2011.  She has reportedly had a normal mammogram x 2 since the finding.  Will order.

## 2011-03-12 ENCOUNTER — Telehealth: Payer: Self-pay | Admitting: Family

## 2011-03-12 ENCOUNTER — Ambulatory Visit (INDEPENDENT_AMBULATORY_CARE_PROVIDER_SITE_OTHER)
Admission: RE | Admit: 2011-03-12 | Discharge: 2011-03-12 | Disposition: A | Discharge status: Home / Self Care | Payer: Commercial Managed Care - PPO | Source: Ambulatory Visit | Attending: Family | Admitting: Family

## 2011-03-12 DIAGNOSIS — R928 Other abnormal and inconclusive findings on diagnostic imaging of breast: Secondary | ICD-10-CM

## 2011-03-12 NOTE — Telephone Encounter (Signed)
Opened in error

## 2011-03-13 ENCOUNTER — Telehealth: Payer: Self-pay | Admitting: Internal Medicine

## 2011-03-13 NOTE — Telephone Encounter (Signed)
Pt requesting results from CT done yesterday.

## 2011-03-13 NOTE — Telephone Encounter (Signed)
Left message on machine to return my call. 

## 2011-03-13 NOTE — Telephone Encounter (Signed)
Please advise 

## 2011-03-13 NOTE — Telephone Encounter (Signed)
Please let patient know that the area of concern looks stable and somewhat improved since the last CT.  We will forward a copy to her GYN.

## 2011-03-16 NOTE — Telephone Encounter (Signed)
Left message on machine to return my call. 

## 2011-03-19 NOTE — Telephone Encounter (Signed)
Left message on machine to return my call. 

## 2011-03-21 ENCOUNTER — Encounter: Payer: Self-pay | Admitting: *Deleted

## 2011-03-21 NOTE — Telephone Encounter (Signed)
Pt has not returned my calls. Letter mailed to pt re: result. CT faxed to Dr Seymour Bars @ [phone].

## 2011-03-21 NOTE — Telephone Encounter (Signed)
Encounter opened in error

## 2011-04-09 ENCOUNTER — Other Ambulatory Visit: Payer: Self-pay | Admitting: Dermatology

## 2011-04-12 ENCOUNTER — Other Ambulatory Visit: Payer: Self-pay | Admitting: Obstetrics & Gynecology

## 2011-07-06 LAB — CBC
Hemoglobin: 13.6
RBC: 4.79

## 2011-07-06 LAB — PREGNANCY, URINE: Preg Test, Ur: NEGATIVE

## 2011-12-14 ENCOUNTER — Other Ambulatory Visit: Payer: Self-pay | Admitting: Internal Medicine

## 2012-01-07 ENCOUNTER — Other Ambulatory Visit: Payer: Self-pay | Admitting: *Deleted

## 2012-01-07 MED ORDER — TRIAMCINOLONE ACETONIDE(NASAL) 55 MCG/ACT NA INHA: 2.0000 | Freq: Every day | NASAL | Status: DC

## 2012-03-16 IMAGING — US US SOFT TISSUE HEAD/NECK
1 series · 14 of 24 positions shown · non-contrast
Comparison: 04/06/2009

CLINICAL DATA: Follow-up of bilateral thyroid nodules.

THYROID ULTRASOUND
TECHNIQUE: Ultrasound examination of the thyroid gland and adjacent
soft tissues was performed.

[Series 1: us soft tissue head/neck · 0.07mm/px · 14 of 24 slices shown]
[im 1/24]
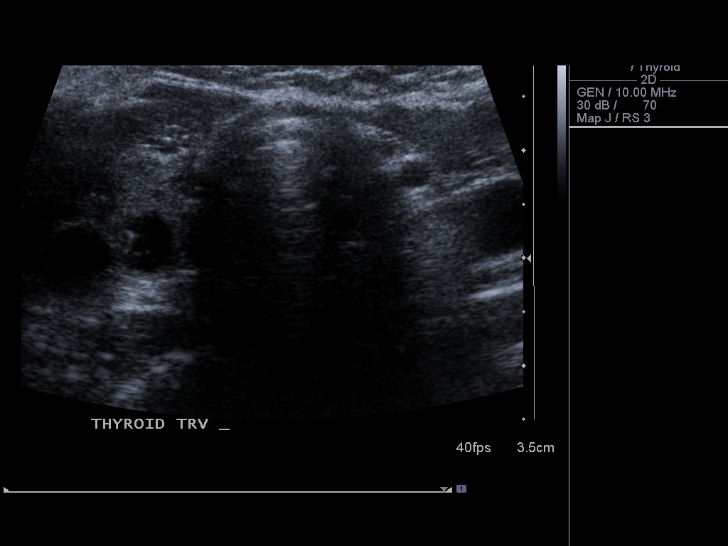
[im 3/24]
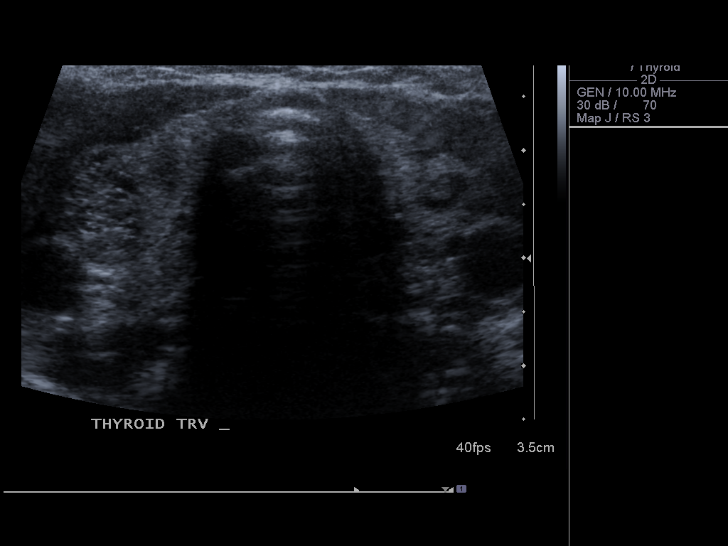
[im 5/24]
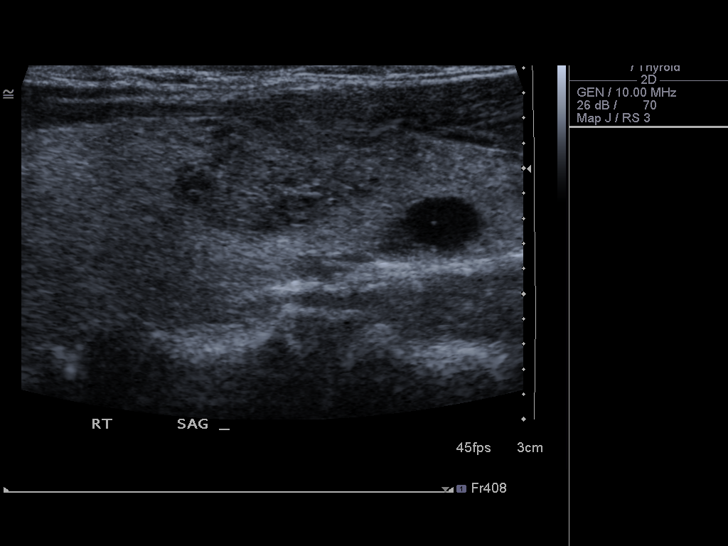
[im 7/24]
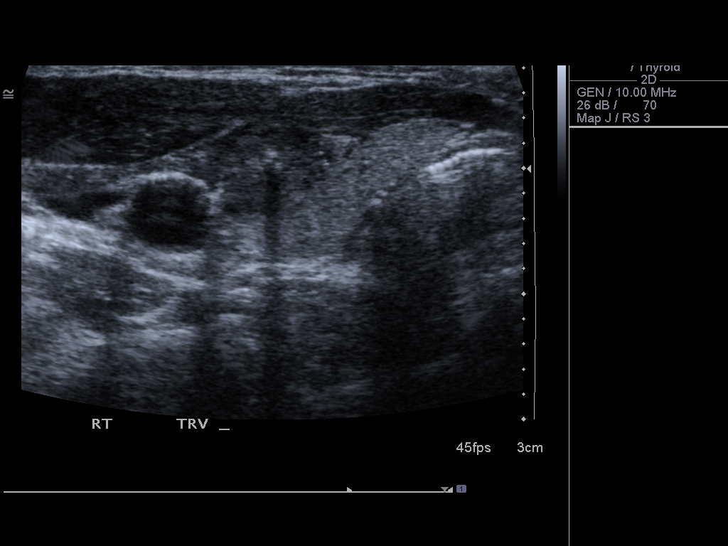
[im 8/24]
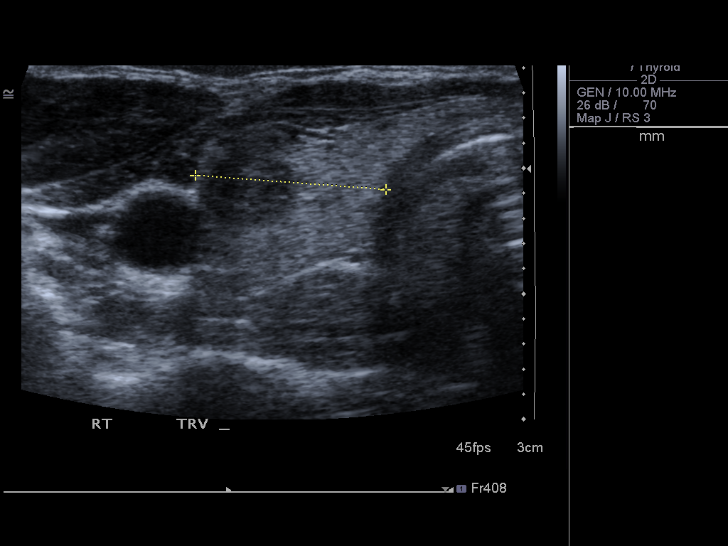
[im 10/24]
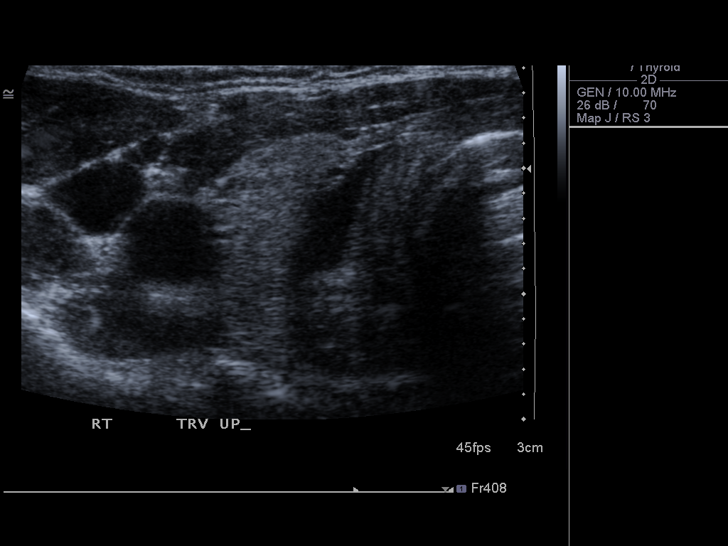
[im 12/24]
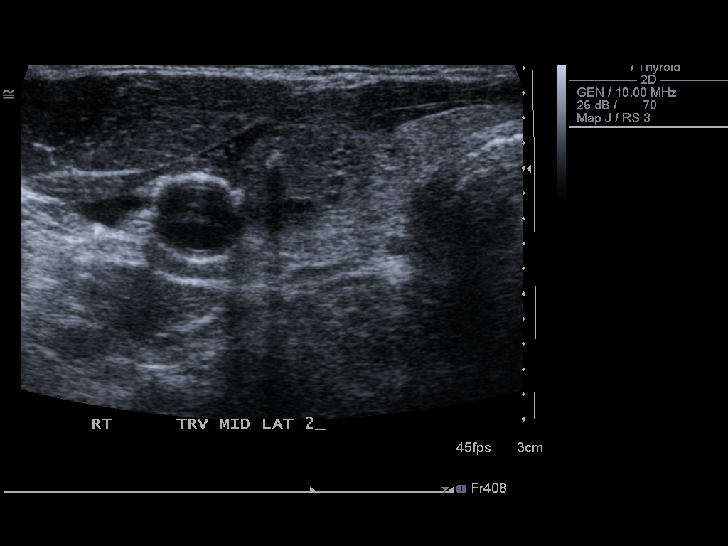
[im 13/24]
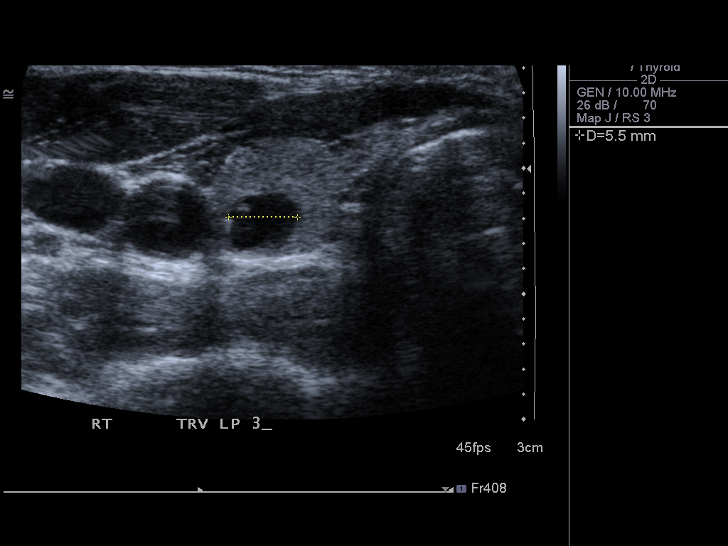
[im 15/24]
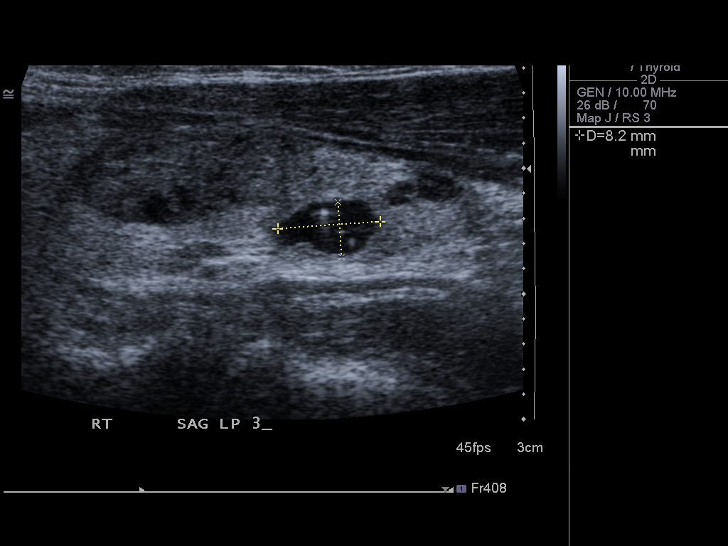
[im 17/24]
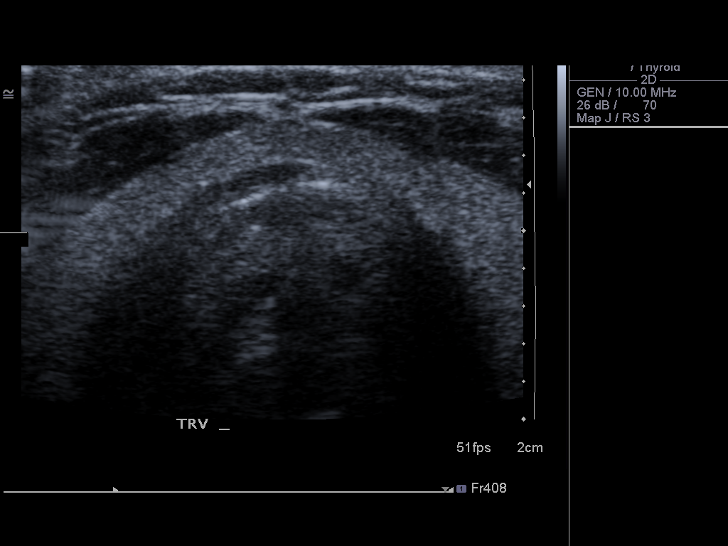
[im 19/24]
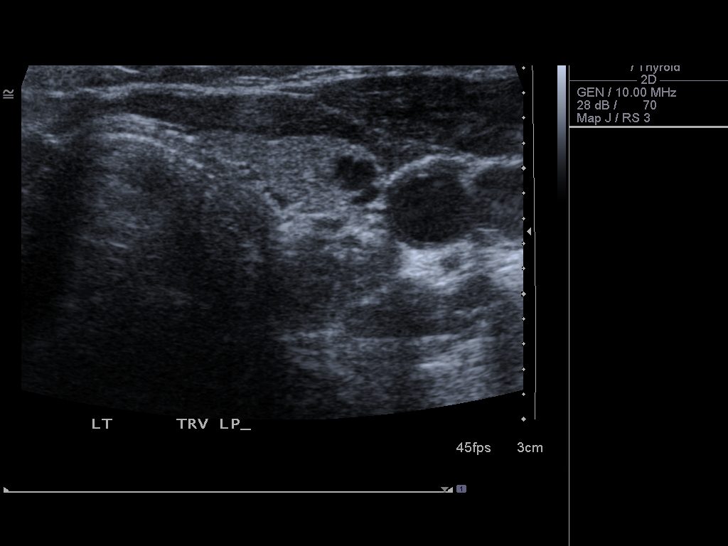
[im 20/24]
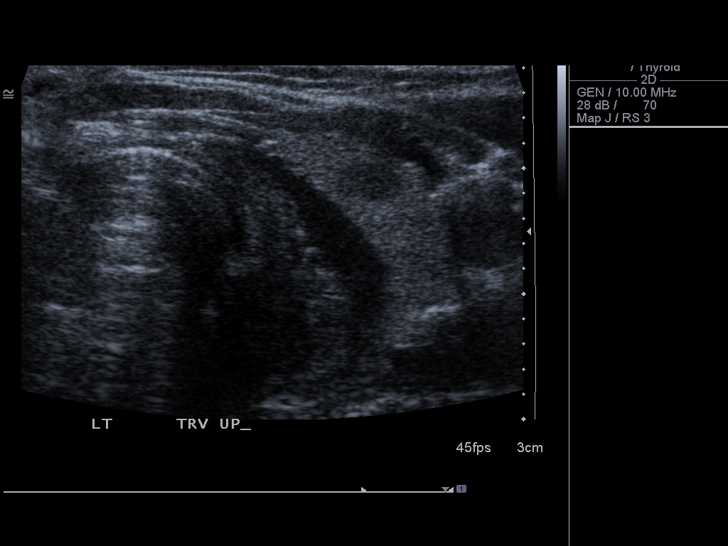
[im 22/24]
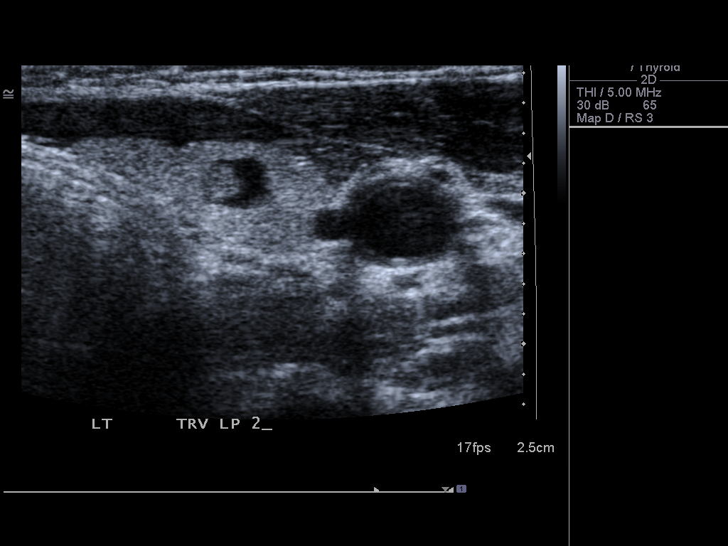
[im 24/24]
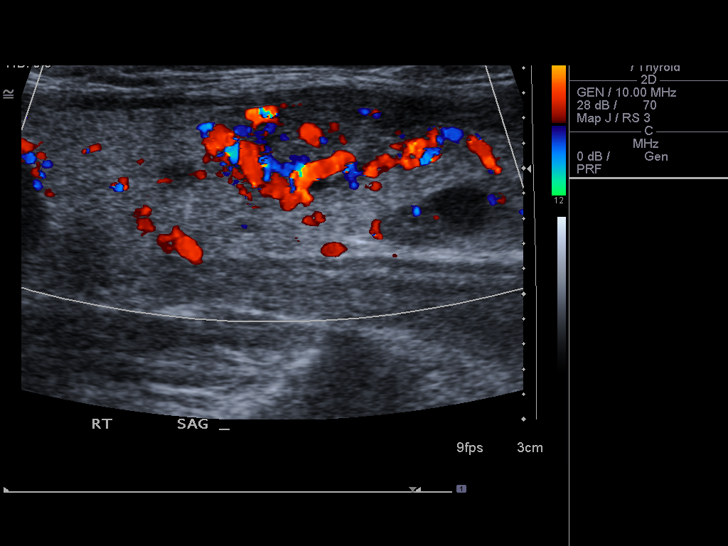

[14 of 24 positions shown; findings below may reference images not displayed]

FINDINGS: Right thyroid lobe:  5.1 x 1.4 x 1.5 cm.
Left thyroid lobe:  4.6 x 1.3 x 1.4 cm.
Isthmus:  0.2 cm.

Focal nodules:  The dominant nodule in the mid right lobe measures
2.0 x 1.1 x 0.9 cm and is complex with some calcifications.  This
has been previously biopsied.  It is unchanged since the prior
exam.

Multiple other small nodules in both lobes are essentially
unchanged since the prior exam.

Lymphadenopathy:  None visualized.
IMPRESSION: Stable multinodular goiter.  No significant change since the prior
exam.

## 2012-11-12 ENCOUNTER — Other Ambulatory Visit (INDEPENDENT_AMBULATORY_CARE_PROVIDER_SITE_OTHER): Payer: Commercial Managed Care - PPO

## 2012-11-12 DIAGNOSIS — Z0000 Encounter for general adult medical examination without abnormal findings: Secondary | ICD-10-CM

## 2012-11-12 LAB — POCT URINALYSIS DIPSTICK
Glucose, UA: NEGATIVE
Leukocytes, UA: NEGATIVE
Nitrite, UA: NEGATIVE
Protein, UA: NEGATIVE
Spec Grav, UA: 1.015
Urobilinogen, UA: 0.2

## 2012-11-12 LAB — TSH: TSH: 1.4 u[IU]/mL (ref 0.35–5.50)

## 2012-11-12 LAB — CBC WITH DIFFERENTIAL/PLATELET
Eosinophils Relative: 1.1 % (ref 0.0–5.0)
HCT: 40.9 % (ref 36.0–46.0)
Hemoglobin: 13.6 g/dL (ref 12.0–15.0)
Lymphs Abs: 1.4 10*3/uL (ref 0.7–4.0)
MCV: 84.3 fl (ref 78.0–100.0)
Monocytes Absolute: 0.4 10*3/uL (ref 0.1–1.0)
Neutro Abs: 3.5 10*3/uL (ref 1.4–7.7)
Platelets: 197 10*3/uL (ref 150.0–400.0)
RDW: 12.9 % (ref 11.5–14.6)
WBC: 5.4 10*3/uL (ref 4.5–10.5)

## 2012-11-12 LAB — LIPID PANEL
Cholesterol: 181 mg/dL (ref 0–200)
LDL Cholesterol: 100 mg/dL — ABNORMAL HIGH (ref 0–99)
VLDL: 9.8 mg/dL (ref 0.0–40.0)

## 2012-11-12 LAB — BASIC METABOLIC PANEL
BUN: 12 mg/dL (ref 6–23)
Chloride: 103 mEq/L (ref 96–112)
Glucose, Bld: 83 mg/dL (ref 70–99)
Potassium: 4 mEq/L (ref 3.5–5.1)
Sodium: 137 mEq/L (ref 135–145)

## 2012-11-12 LAB — HEPATIC FUNCTION PANEL
AST: 23 U/L (ref 0–37)
Albumin: 4.3 g/dL (ref 3.5–5.2)
Total Bilirubin: 0.5 mg/dL (ref 0.3–1.2)

## 2012-11-13 ENCOUNTER — Ambulatory Visit (INDEPENDENT_AMBULATORY_CARE_PROVIDER_SITE_OTHER): Payer: 59 | Admitting: Internal Medicine

## 2012-11-13 ENCOUNTER — Encounter: Payer: Self-pay | Admitting: Internal Medicine

## 2012-11-13 VITALS — BP 134/88 | HR 64 | Temp 98.3°F | Ht 64.0 in | Wt 143.0 lb

## 2012-11-13 DIAGNOSIS — E041 Nontoxic single thyroid nodule: Secondary | ICD-10-CM

## 2012-11-13 DIAGNOSIS — R209 Unspecified disturbances of skin sensation: Secondary | ICD-10-CM

## 2012-11-13 DIAGNOSIS — Z0000 Encounter for general adult medical examination without abnormal findings: Secondary | ICD-10-CM

## 2012-11-13 DIAGNOSIS — R200 Anesthesia of skin: Secondary | ICD-10-CM | POA: Insufficient documentation

## 2012-11-13 NOTE — Patient Instructions (Signed)
Call our office if your right hand numbness gets worse Consider massage therapy - Hortencia Conradi [facility] [phone]

## 2012-11-13 NOTE — Assessment & Plan Note (Signed)
Arrange surveillance thyroid ultrasound

## 2012-11-13 NOTE — Assessment & Plan Note (Signed)
Reviewed adult health maintenance protocols.  Patient to for screening mammogram in March. She will turn 52 this year. I suggest a complete immunoassay/iFOB for colon cancer screening.  She would like to defer colonoscopy until next year.  She is up to date with adult vaccines. Screening lipid within normal limits.  Continue regular exercise program.

## 2012-11-13 NOTE — Progress Notes (Signed)
Subjective:    Patient ID: Gabrielle Hernandez, female    DOB: Jan 22, 1961, 52 y.o.   MRN: [id number]  HPI  52 year old white female with history of thyroid nodule for routine physical. She denies any significant interval medical history. She has been exercising on regular basis. Patient reports intermittent numbness in her right thumb and arm. Symptoms occur while she is performing certain exercises. She has occasional neck pain. She denies any upper extremity weakness.  She has decreased her intense exercise program. Her symptoms have improved.  Patient reports her last mammogram was normal. She is due for her followup mammogram in March.  She does not have family history of colon cancer.    Review of Systems  Constitutional: Negative for activity change, appetite change and unexpected weight change.  Eyes: Negative for visual disturbance.  Respiratory: Negative for cough, chest tightness and shortness of breath.   Cardiovascular: Negative for chest pain.  Genitourinary: Negative for difficulty urinating.  Neurological: Negative for headaches.  Gastrointestinal: Negative for abdominal pain, heartburn melena or hematochezia Psych: Negative for depression or anxiety  Past Medical History  Diagnosis Date  . GERD (gastroesophageal reflux disease)   . Allergy   . Superficial basal cell carcinoma 7/11    right shoulder  . Multinodular goiter 2009    right thyroid nodule, neg FNA     History   Social History  . Marital Status: Married    Spouse Name: N/A    Number of Children: N/A  . Years of Education: N/A   Occupational History  . Not on file.   Social History Main Topics  . Smoking status: Former Smoker    Start date: 11/13/1989  . Smokeless tobacco: Not on file  . Alcohol Use: Yes  . Drug Use: No  . Sexually Active: Not on file   Other Topics Concern  . Not on file   Social History Narrative  . No narrative on file    Past Surgical History  Procedure Date  .  Thyroid surgery 7/11    right FNA  . Cholecystectomy 1993  . Tubal ligation 1996    Family History  Problem Relation Age of Onset  . Hypertension Mother   . Hypertension Brother   . Heart disease Maternal Grandfather   . Parkinson's disease Maternal Grandfather   . Cancer Paternal Grandmother     stomach  . Colon cancer Neg Hx     No Known Allergies  Current Outpatient Prescriptions on File Prior to Visit  Medication Sig Dispense Refill  . triamcinolone (NASACORT AQ) 55 MCG/ACT nasal inhaler Place 2 sprays into the nose daily.  1 Inhaler  12    BP 134/88  Pulse 64  Temp 98.3 F (36.8 C) (Oral)  Ht 5\' 4"  (1.626 m)  Wt 143 lb (64.864 kg)  BMI 24.55 kg/m2          Objective:   Physical Exam  Constitutional: She is oriented to person, place, and time. She appears well-developed and well-nourished.  HENT:  Head: Normocephalic and atraumatic.  Right Ear: External ear normal.  Left Ear: External ear normal.  Mouth/Throat: Oropharynx is clear and moist.  Eyes: EOM are normal. Pupils are equal, round, and reactive to light.  Neck: Neck supple. No thyromegaly present.  Cardiovascular: Normal rate, regular rhythm and normal heart sounds.   No murmur heard. Pulmonary/Chest: Effort normal and breath sounds normal. She has no wheezes.  Abdominal: Soft. Bowel sounds are normal. She exhibits no mass. There is  no tenderness.  Musculoskeletal: She exhibits no edema.       Slight decrease in right deltoid muscle strength Pain in c spine with leftward side bending  Lymphadenopathy:    She has no cervical adenopathy.  Neurological: She is alert and oriented to person, place, and time. No cranial nerve deficit.  Skin: Skin is warm and dry.          Assessment & Plan:

## 2012-11-13 NOTE — Assessment & Plan Note (Signed)
Patient reports intermittent right arm numbness with certain exercises. Symptoms more prominent in right thumb.  Patient has some pain with cervical side bending. She may have cervical radiculopathy. I suggest stretching exercises for now. If worsening symptoms consider MRI of C-spine and/or nerve conduction study.

## 2012-11-18 ENCOUNTER — Ambulatory Visit (HOSPITAL_BASED_OUTPATIENT_CLINIC_OR_DEPARTMENT_OTHER): Payer: 59

## 2012-11-18 ENCOUNTER — Ambulatory Visit (HOSPITAL_BASED_OUTPATIENT_CLINIC_OR_DEPARTMENT_OTHER)
Admission: RE | Admit: 2012-11-18 | Discharge: 2012-11-18 | Disposition: A | Discharge status: Home / Self Care | Payer: 59 | Source: Ambulatory Visit | Attending: Internal Medicine | Admitting: Internal Medicine

## 2012-11-18 DIAGNOSIS — E041 Nontoxic single thyroid nodule: Secondary | ICD-10-CM | POA: Insufficient documentation

## 2012-11-26 ENCOUNTER — Other Ambulatory Visit: Payer: 59

## 2012-11-26 ENCOUNTER — Other Ambulatory Visit: Payer: Self-pay | Admitting: Internal Medicine

## 2012-11-26 DIAGNOSIS — Z0000 Encounter for general adult medical examination without abnormal findings: Secondary | ICD-10-CM

## 2012-11-26 LAB — FECAL OCCULT BLOOD, IMMUNOCHEMICAL: Fecal Occult Bld: NEGATIVE

## 2012-12-24 ENCOUNTER — Encounter: Payer: Self-pay | Admitting: Internal Medicine

## 2014-04-13 ENCOUNTER — Other Ambulatory Visit: Payer: Self-pay | Admitting: Dermatology

## 2014-08-17 ENCOUNTER — Encounter: Payer: Self-pay | Admitting: Internal Medicine

## 2014-10-20 ENCOUNTER — Encounter: Payer: Self-pay | Admitting: Internal Medicine

## 2014-10-20 ENCOUNTER — Ambulatory Visit (INDEPENDENT_AMBULATORY_CARE_PROVIDER_SITE_OTHER): Payer: Self-pay | Admitting: Internal Medicine

## 2014-10-20 VITALS — BP 138/76 | HR 68 | Ht 64.0 in | Wt 139.1 lb

## 2014-10-20 DIAGNOSIS — Z1211 Encounter for screening for malignant neoplasm of colon: Secondary | ICD-10-CM

## 2014-10-20 NOTE — Patient Instructions (Addendum)
You have been scheduled for a colonoscopy. Please follow written instructions given to you at your visit today.  Please pick up your prep supplies at the pharmacy. If you use inhalers (even only as needed), please bring them with you on the day of your procedure. Your physician has requested that you go to www.startemmi.com and enter the access code given to you at your visit today. This web site gives a general overview about your procedure. However, you should still follow specific instructions given to you by our office regarding your preparation for the procedure.   I appreciate the opportunity to care for you.  

## 2014-10-20 NOTE — Progress Notes (Signed)
   Subjective:    Patient ID: Gabrielle Hernandez, female    DOB: 1960-11-24, 54 y.o.   MRN: [id number]  HPI Jacqlyn Larsen is here to discuss having a screening colonoscopy. He is not having any active GI complaints. She does have a history of acid reflux.  Medications, allergies, past medical history, past surgical history, family history and social history are reviewed and updated in the EMR.  Review of Systems Otherwise negative    Objective:   Physical Exam Well-developed well-nourished no acute distress       Assessment & Plan:   1. Special screening for malignant neoplasms, colon    She will schedule a screening colonoscopy,The risks and benefits as well as alternatives of endoscopic procedure(s) have been discussed and reviewed. All questions answered. The patient agrees to proceed.  Mira Lax split dose prep will be used. She says sometimes she has extreme hunger pains if she goes and fast for a while, I have told her she could have a soft breakfast the day before and maybe you and a small soft lunch at same day before colonoscopy.

## 2014-12-16 ENCOUNTER — Encounter: Payer: 59 | Admitting: Internal Medicine

## 2014-12-24 ENCOUNTER — Ambulatory Visit (AMBULATORY_SURGERY_CENTER): Payer: 59 | Admitting: Internal Medicine

## 2014-12-24 ENCOUNTER — Encounter: Payer: Self-pay | Admitting: Internal Medicine

## 2014-12-24 VITALS — BP 121/77 | HR 59 | Temp 98.1°F | Resp 14 | Ht 64.0 in | Wt 139.0 lb

## 2014-12-24 DIAGNOSIS — Z1211 Encounter for screening for malignant neoplasm of colon: Secondary | ICD-10-CM

## 2014-12-24 MED ORDER — SODIUM CHLORIDE 0.9 % IV SOLN: 500.0000 mL | INTRAVENOUS | Status: DC

## 2014-12-24 NOTE — Patient Instructions (Addendum)
The exam was normal!  Next routine colonoscopy in 10 years - 2026  I appreciate the opportunity to care for you. Gatha Mayer, MD, FACG  YOU HAD AN ENDOSCOPIC PROCEDURE TODAY AT [facility]:   Refer to the procedure report that was given to you for any specific questions about what was found during the examination.  If the procedure report does not answer your questions, please call your gastroenterologist to clarify.  If you requested that your care partner not be given the details of your procedure findings, then the procedure report has been included in a sealed envelope for you to review at your convenience later.  YOU SHOULD EXPECT: Some feelings of bloating in the abdomen. Passage of more gas than usual.  Walking can help get rid of the air that was put into your GI tract during the procedure and reduce the bloating. If you had a lower endoscopy (such as a colonoscopy or flexible sigmoidoscopy) you may notice spotting of blood in your stool or on the toilet paper. If you underwent a bowel prep for your procedure, you may not have a normal bowel movement for a few days.  Please Note:  You might notice some irritation and congestion in your nose or some drainage.  This is from the oxygen used during your procedure.  There is no need for concern and it should clear up in a day or so.  SYMPTOMS TO REPORT IMMEDIATELY:   Following lower endoscopy (colonoscopy or flexible sigmoidoscopy):  Excessive amounts of blood in the stool  Significant tenderness or worsening of abdominal pains  Swelling of the abdomen that is new, acute  Fever of 100F or higher    For urgent or emergent issues, a gastroenterologist can be reached at any hour by calling [phone].   DIET: Your first meal following the procedure should be a small meal and then it is ok to progress to your normal diet. Heavy or fried foods are harder to digest and may make you feel nauseous or bloated.   Likewise, meals heavy in dairy and vegetables can increase bloating.  Drink plenty of fluids but you should avoid alcoholic beverages for 24 hours.  ACTIVITY:  You should plan to take it easy for the rest of today and you should NOT DRIVE or use heavy machinery until tomorrow (because of the sedation medicines used during the test).    FOLLOW UP: Our staff will call the number listed on your records the next business day following your procedure to check on you and address any questions or concerns that you may have regarding the information given to you following your procedure. If we do not reach you, we will leave a message.  However, if you are feeling well and you are not experiencing any problems, there is no need to return our call.  We will assume that you have returned to your regular daily activities without incident.  If any biopsies were taken you will be contacted by phone or by letter within the next 1-3 weeks.  Please call us at [phone] if you have not heard about the biopsies in 3 weeks.    SIGNATURES/CONFIDENTIALITY: You and/or your care partner have signed paperwork which will be entered into your electronic medical record.  These signatures attest to the fact that that the information above on your After Visit Summary has been reviewed and is understood.  Full responsibility of the confidentiality of this discharge information lies  with you and/or your care-partner.

## 2014-12-24 NOTE — Op Note (Signed)
[place]  [address] [place], [id number]   COLONOSCOPY PROCEDURE REPORT  PATIENT: Gabrielle Hernandez, Gabrielle Hernandez  MR#: [id number] BIRTHDATE: 1960/10/31 , 53  yrs. old GENDER: female ENDOSCOPIST: Gatha Mayer, MD, [facility] PROCEDURE DATE:  12/24/2014 PROCEDURE:   Colonoscopy, screening First Screening Colonoscopy - Avg.  risk and is 50 yrs.  old or older Yes.  Prior Negative Screening - Now for repeat screening. N/A  History of Adenoma - Now for follow-up colonoscopy & has been > or = to 3 yrs.  N/A ASA CLASS:   Class I INDICATIONS:Screening for colonic neoplasia and Colorectal Neoplasm Risk Assessment for this procedure is average risk. MEDICATIONS: Propofol 350 mg IV and Monitored anesthesia care  DESCRIPTION OF PROCEDURE:   After the risks benefits and alternatives of the procedure were thoroughly explained, informed consent was obtained.  The digital rectal exam revealed no abnormalities of the rectum.   The     endoscope was introduced through the anus and advanced to the cecum, which was identified by both the appendix and ileocecal valve. No adverse events experienced.   The quality of the prep was excellent.  (MiraLax was used)  The instrument was then slowly withdrawn as the colon was fully examined.      COLON FINDINGS: A normal appearing cecum, ileocecal valve, and appendiceal orifice were identified.  The ascending, transverse, descending, sigmoid colon, and rectum appeared unremarkable. Right colon retroflexion included.  Retroflexed views revealed no abnormalities. The time to cecum = 5.5 Withdrawal time = 12.1   The scope was withdrawn and the procedure completed. COMPLICATIONS: There were no immediate complications.  ENDOSCOPIC IMPRESSION: Normal colonoscopy - excellent prep - first screening  RECOMMENDATIONS: Repeat colonoscopy 10 years.  eSigned:  Gatha Mayer, MD, [facility] 12/24/2014 10:40 AM   cc: The Patient and Dr. Drema Pry

## 2014-12-24 NOTE — Progress Notes (Signed)
To recovery. Report to [place], Therapist, sports. VSS

## 2014-12-27 ENCOUNTER — Telehealth: Payer: Self-pay

## 2014-12-27 NOTE — Telephone Encounter (Signed)
  Follow up Call-  Call back number 12/24/2014  Post procedure Call Back phone  # [phone]  Permission to leave phone message Yes     Patient questions:  Do you have a fever, pain , or abdominal swelling? No. Pain Score  0 *  Have you tolerated food without any problems? Yes.    Have you been able to return to your normal activities? Yes.    Do you have any questions about your discharge instructions: Diet   No. Medications  No. Follow up visit  No.  Do you have questions or concerns about your Care? No.  Actions: * If pain score is 4 or above: No action needed, pain <4.

## 2015-04-29 ENCOUNTER — Telehealth: Payer: Self-pay | Admitting: Internal Medicine

## 2015-04-29 DIAGNOSIS — E041 Nontoxic single thyroid nodule: Secondary | ICD-10-CM

## 2015-04-29 NOTE — Telephone Encounter (Signed)
Patient would like an ultrasound of her thyroid.  She is scheduled for CPX on 06/03/15.

## 2015-05-02 NOTE — Telephone Encounter (Signed)
Order placed

## 2015-05-02 NOTE — Telephone Encounter (Signed)
Ok to set up thyroid u/s.  Use thyroid nodule code

## 2015-05-27 ENCOUNTER — Other Ambulatory Visit (INDEPENDENT_AMBULATORY_CARE_PROVIDER_SITE_OTHER): Payer: 59

## 2015-05-27 ENCOUNTER — Ambulatory Visit
Admission: RE | Admit: 2015-05-27 | Discharge: 2015-05-27 | Disposition: A | Discharge status: Home / Self Care | Payer: 59 | Source: Ambulatory Visit | Attending: Internal Medicine | Admitting: Internal Medicine

## 2015-05-27 DIAGNOSIS — E041 Nontoxic single thyroid nodule: Secondary | ICD-10-CM

## 2015-05-27 DIAGNOSIS — Z0000 Encounter for general adult medical examination without abnormal findings: Secondary | ICD-10-CM | POA: Diagnosis not present

## 2015-05-27 LAB — POCT URINALYSIS DIPSTICK
BILIRUBIN UA: NEGATIVE
GLUCOSE UA: NEGATIVE
KETONES UA: NEGATIVE
NITRITE UA: NEGATIVE
PH UA: 7
Protein, UA: NEGATIVE
RBC UA: NEGATIVE
SPEC GRAV UA: 1.01
Urobilinogen, UA: 0.2

## 2015-05-27 LAB — CBC WITH DIFFERENTIAL/PLATELET
Basophils Absolute: 0 10*3/uL (ref 0.0–0.1)
Basophils Relative: 0.7 % (ref 0.0–3.0)
EOS PCT: 1 % (ref 0.0–5.0)
Eosinophils Absolute: 0.1 10*3/uL (ref 0.0–0.7)
HEMATOCRIT: 43 % (ref 36.0–46.0)
Hemoglobin: 14.3 g/dL (ref 12.0–15.0)
LYMPHS ABS: 1.3 10*3/uL (ref 0.7–4.0)
LYMPHS PCT: 23.7 % (ref 12.0–46.0)
MCHC: 33.2 g/dL (ref 30.0–36.0)
MCV: 85.7 fl (ref 78.0–100.0)
MONOS PCT: 8.9 % (ref 3.0–12.0)
Monocytes Absolute: 0.5 10*3/uL (ref 0.1–1.0)
NEUTROS ABS: 3.7 10*3/uL (ref 1.4–7.7)
NEUTROS PCT: 65.7 % (ref 43.0–77.0)
Platelets: 198 10*3/uL (ref 150.0–400.0)
RBC: 5.02 Mil/uL (ref 3.87–5.11)
RDW: 14.2 % (ref 11.5–15.5)
WBC: 5.6 10*3/uL (ref 4.0–10.5)

## 2015-05-27 LAB — BASIC METABOLIC PANEL
BUN: 16 mg/dL (ref 6–23)
CO2: 29 meq/L (ref 19–32)
Calcium: 9.3 mg/dL (ref 8.4–10.5)
Chloride: 102 mEq/L (ref 96–112)
Creatinine, Ser: 0.91 mg/dL (ref 0.40–1.20)
GFR: 68.92 mL/min (ref >60.00)
GLUCOSE: 80 mg/dL (ref 70–99)
POTASSIUM: 4.1 meq/L (ref 3.5–5.1)
SODIUM: 139 meq/L (ref 135–145)

## 2015-05-27 LAB — HEPATIC FUNCTION PANEL
ALBUMIN: 4.4 g/dL (ref 3.5–5.2)
ALT: 17 U/L (ref 0–35)
AST: 23 U/L (ref 0–37)
Alkaline Phosphatase: 71 U/L (ref 39–117)
Bilirubin, Direct: 0.1 mg/dL (ref 0.0–0.3)
TOTAL PROTEIN: 7 g/dL (ref 6.0–8.3)
Total Bilirubin: 0.6 mg/dL (ref 0.2–1.2)

## 2015-05-27 LAB — LIPID PANEL
CHOL/HDL RATIO: 3
Cholesterol: 194 mg/dL (ref 0–200)
HDL: 75.3 mg/dL (ref >39.00)
LDL CALC: 109 mg/dL — AB (ref 0–99)
NONHDL: 118.22
TRIGLYCERIDES: 44 mg/dL (ref 0.0–149.0)
VLDL: 8.8 mg/dL (ref 0.0–40.0)

## 2015-05-27 LAB — TSH: TSH: 1.42 u[IU]/mL (ref 0.35–4.50)

## 2015-05-31 ENCOUNTER — Other Ambulatory Visit: Payer: Self-pay | Admitting: Internal Medicine

## 2015-05-31 DIAGNOSIS — E041 Nontoxic single thyroid nodule: Secondary | ICD-10-CM

## 2015-06-03 ENCOUNTER — Encounter: Payer: 59 | Admitting: Internal Medicine

## 2015-07-08 ENCOUNTER — Encounter: Payer: 59 | Admitting: Internal Medicine

## 2015-07-08 ENCOUNTER — Encounter: Payer: Self-pay | Admitting: Endocrinology

## 2015-08-22 ENCOUNTER — Ambulatory Visit (INDEPENDENT_AMBULATORY_CARE_PROVIDER_SITE_OTHER): Payer: 59 | Admitting: Adult Health

## 2015-08-22 ENCOUNTER — Encounter: Payer: Self-pay | Admitting: Adult Health

## 2015-08-22 VITALS — BP 120/80 | Temp 98.1°F | Ht 64.0 in | Wt 147.8 lb

## 2015-08-22 DIAGNOSIS — Z23 Encounter for immunization: Secondary | ICD-10-CM | POA: Diagnosis not present

## 2015-08-22 DIAGNOSIS — Z0000 Encounter for general adult medical examination without abnormal findings: Secondary | ICD-10-CM

## 2015-08-22 NOTE — Progress Notes (Signed)
Pre visit review using our clinic review tool, if applicable. No additional management support is needed unless otherwise documented below in the visit note. 

## 2015-08-22 NOTE — Patient Instructions (Signed)
It was a pleasure meeting you today!  Continue to do whatever you are doing and follow up with Dr. Shawna Orleans as needed.   If you need anything while Dr. Shawna Orleans is out, please do not hesitate to let me know.

## 2015-08-22 NOTE — Progress Notes (Signed)
Subjective:    Patient ID: Gabrielle Hernandez, female    DOB: [birth year], 54 y.o.   MRN: [id number]  HPI  54 year old pleasant female, patient of Dr. Lora Havens, who presents today for her annual wellness exam.   All immunizations and health maintenance protocols were reviewed with the patient and needed orders were placed. Flu and Tdap given  Appropriate screening laboratory values were reviewed with the  patient including screening of hyperlipidemia, renal function and hepatic function.  Medication reconciliation,  past medical history, social history, problem list and allergies were reviewed in detail with the patient  Goals were established with regard to weight loss, exercise, and  diet in compliance with medications  End of life planning was discussed.  She has started seeing Endocrinology for her thyroid but has no issues with it.   Has seen her GYN, had her mammogram and colonoscopy this year.   Review of Systems  Constitutional: Negative.   HENT: Negative.   Eyes: Negative.   Respiratory: Negative.   Cardiovascular: Negative.   Gastrointestinal: Negative.   Endocrine: Negative.   Genitourinary: Negative.   Musculoskeletal: Negative.   Skin: Negative.   Allergic/Immunologic: Negative.   Neurological: Negative.   Hematological: Negative.   Psychiatric/Behavioral: Negative.   All other systems reviewed and are negative.  Past Medical History  Diagnosis Date  . GERD (gastroesophageal reflux disease)   . Allergy   . Superficial basal cell carcinoma 7/11    right shoulder  . Multinodular goiter 2009    right thyroid nodule, neg FNA   . Gallstones 1993  . Urinary tract infection 1997    Social History   Social History  . Marital Status: Married    Spouse Name: N/A  . Number of Children: 3  . Years of Education: N/A   Occupational History  . Web designer    Social History Main Topics  . Smoking status: Former Smoker    Start date: 11/13/1989    . Smokeless tobacco: Never Used  . Alcohol Use: 0.0 oz/week    0 Standard drinks or equivalent per week     Comment: 1 glass of wine a night  . Drug Use: No  . Sexual Activity: Not on file   Other Topics Concern  . Not on file   Social History Narrative   Married to Dr. Kirk Ruths of cardiology   2 biologic adult sons one adopted daughter   Works part-time as an Web designer    Past Surgical History  Procedure Laterality Date  . Thyroid surgery  7/11    thyroid biopsy  . Cholecystectomy  1993  . Tubal ligation  1996  . Esophagogastroduodenoscopy    . Dilation and curettage of uterus      Family History  Problem Relation Age of Onset  . Hypertension Mother   . Hypertension Brother   . Heart disease Maternal Grandfather   . Parkinson's disease Maternal Grandfather   . Stomach cancer Paternal Grandmother   . Colon cancer Neg Hx   . Coronary artery disease Maternal Grandfather   . Kidney disease Neg Hx   . Gallbladder disease Neg Hx   . Esophageal cancer Neg Hx     Allergies  Allergen Reactions  . Latex Itching    Pt gets itchy with contact with latex    Current Outpatient Prescriptions on File Prior to Visit  Medication Sig Dispense Refill  . triamcinolone (NASACORT AQ) 55 MCG/ACT nasal inhaler Place 2 sprays into  the nose daily. 1 Inhaler 12   No current facility-administered medications on file prior to visit.    BP 120/80 mmHg  Temp(Src) 98.1 F (36.7 C) (Oral)  Ht 5\' 4"  (1.626 m)  Wt 147 lb 12.8 oz (67.042 kg)  BMI 25.36 kg/m2  LMP 10/22/2012       Objective:   Physical Exam  Constitutional: She is oriented to person, place, and time. She appears well-developed and well-nourished. No distress.  HENT:  Head: Normocephalic and atraumatic.  Right Ear: External ear normal.  Left Ear: External ear normal.  Nose: Nose normal.  Mouth/Throat: Oropharynx is clear and moist.  Cerumen impaction in right ear  Eyes: Conjunctivae and EOM  are normal. Pupils are equal, round, and reactive to light. Right eye exhibits no discharge. Left eye exhibits no discharge. No scleral icterus.  Neck: Normal range of motion. Neck supple. No thyromegaly present.  Cardiovascular: Normal rate, regular rhythm, normal heart sounds and intact distal pulses.  Exam reveals no gallop and no friction rub.   No murmur heard. Pulmonary/Chest: Effort normal and breath sounds normal. No respiratory distress. She has no wheezes. She has no rales. She exhibits no tenderness.  Abdominal: Soft. Bowel sounds are normal. She exhibits no distension and no mass. There is no tenderness. There is no rebound and no guarding.  Genitourinary:  Deferred, has been done this year  Musculoskeletal: Normal range of motion. She exhibits no edema or tenderness.  Lymphadenopathy:    She has no cervical adenopathy.  Neurological: She is alert and oriented to person, place, and time.  Skin: Skin is warm and dry. No rash noted. She is not diaphoretic. No erythema. No pallor.  Psychiatric: She has a normal mood and affect. Her behavior is normal. Judgment and thought content normal.  Nursing note and vitals reviewed.     Assessment & Plan:  1. Routine general medical examination at a health care facility - Benign exam  - Reviewed labs - Follow up in one year for CPE, follow up sooner if needed - Cerumen impaction in right ear flushed- no signs of infection  2. Need for tetanus booster - Tdap vaccine greater than or equal to 7yo IM  3. Encounter for immunization Flu shot given

## 2015-11-30 DIAGNOSIS — D225 Melanocytic nevi of trunk: Secondary | ICD-10-CM | POA: Diagnosis not present

## 2015-11-30 DIAGNOSIS — D1801 Hemangioma of skin and subcutaneous tissue: Secondary | ICD-10-CM | POA: Diagnosis not present

## 2015-11-30 DIAGNOSIS — D2362 Other benign neoplasm of skin of left upper limb, including shoulder: Secondary | ICD-10-CM | POA: Diagnosis not present

## 2015-11-30 DIAGNOSIS — L918 Other hypertrophic disorders of the skin: Secondary | ICD-10-CM | POA: Diagnosis not present

## 2015-11-30 DIAGNOSIS — L821 Other seborrheic keratosis: Secondary | ICD-10-CM | POA: Diagnosis not present

## 2015-11-30 DIAGNOSIS — L570 Actinic keratosis: Secondary | ICD-10-CM | POA: Diagnosis not present

## 2015-11-30 DIAGNOSIS — D2272 Melanocytic nevi of left lower limb, including hip: Secondary | ICD-10-CM | POA: Diagnosis not present

## 2015-12-29 ENCOUNTER — Ambulatory Visit (INDEPENDENT_AMBULATORY_CARE_PROVIDER_SITE_OTHER): Payer: 59 | Admitting: Cardiology

## 2015-12-29 ENCOUNTER — Encounter: Payer: Self-pay | Admitting: Cardiology

## 2015-12-29 VITALS — BP 136/88 | HR 91 | Ht 64.0 in | Wt 149.2 lb

## 2015-12-29 DIAGNOSIS — F419 Anxiety disorder, unspecified: Secondary | ICD-10-CM | POA: Diagnosis not present

## 2015-12-29 DIAGNOSIS — R079 Chest pain, unspecified: Secondary | ICD-10-CM | POA: Diagnosis not present

## 2015-12-29 DIAGNOSIS — R002 Palpitations: Secondary | ICD-10-CM

## 2015-12-29 LAB — TROPONIN I: Troponin I: <0.03 ng/mL (ref <0.031)

## 2015-12-29 NOTE — Progress Notes (Signed)
Cardiology Office Note    Date:  12/29/2015   ID:  [name], [name] 1961-05-26, MRN [id number]  PCP:  Drema Pry, DO  Cardiologist:   Candee Furbish, MD     History of Present Illness:  Gabrielle Hernandez is a 55 y.o. female here for evaluation of palpitations. Earlier today felt her heart not beating incorrect rhythm.  This is the first time she gone to [facility] for exercise in the morning at 5:00am and she had a very intense workout she states. Elliptical machine for instance. She did not feel any chest tightness during the exercise workout, no syncope. When she came home however she started to feel poor. She had some nausea, had 2 bouts of diarrhea and felt as though her heart was not beating incorrect rhythm. She was quite anxious and has had anxiety in the past but usually is able to correct through deep breathing exercise AM. She does not believe that she has felt like this before. At one point in home and she was quite anxious, she felt somewhat lightheaded.  When I entered the room, she was pacing quite anxious. EKG was reassuring. No ST segment changes. She felt as though she was having this incorrect rhythm and I checked her on auscultation and her heart was regular. She does have minor variation with sinus arrhythmia.  She also stated that she was having some chest discomfort, put her hand up to her chest in a fist-like pattern. She felt this at this time. She did not feel this during exercise. This pain did not change when she stretched her arms back.  Her sister has atrial fibrillation.    Past Medical History  Diagnosis Date  . GERD (gastroesophageal reflux disease)   . Allergy   . Superficial basal cell carcinoma 7/11    right shoulder  . Multinodular goiter 2009    right thyroid nodule, neg FNA   . Gallstones 1993  . Urinary tract infection 1997    Past Surgical History  Procedure Laterality Date  . Thyroid surgery  7/11    thyroid biopsy  .  Cholecystectomy  1993  . Tubal ligation  1996  . Esophagogastroduodenoscopy    . Dilation and curettage of uterus      Outpatient Prescriptions Prior to Visit  Medication Sig Dispense Refill  . triamcinolone (NASACORT AQ) 55 MCG/ACT nasal inhaler Place 2 sprays into the nose daily. 1 Inhaler 12   No facility-administered medications prior to visit.     Allergies:   Latex   Social History   Social History  . Marital Status: Married    Spouse Name: N/A  . Number of Children: 3  . Years of Education: N/A   Occupational History  . Web designer    Social History Main Topics  . Smoking status: Former Smoker    Start date: 11/13/1989  . Smokeless tobacco: Never Used  . Alcohol Use: 0.0 oz/week    0 Standard drinks or equivalent per week     Comment: 1 glass of wine a night  . Drug Use: No  . Sexual Activity: Not Asked   Other Topics Concern  . None   Social History Narrative   Married to Dr. Kirk Ruths of cardiology   2 biologic adult sons one adopted daughter   Works part-time as an Web designer     Family History:  The patient's family history includes Coronary artery disease in her maternal grandfather; Heart disease in her  maternal grandfather; Hypertension in her brother and mother; Parkinson's disease in her maternal grandfather; Stomach cancer in her paternal grandmother. There is no history of Colon cancer, Kidney disease, Gallbladder disease, or Esophageal cancer.   ROS:   Please see the history of present illness.    ROS no recent fevers, sick contacts, no viruses running through the house. No orthopnea, no PND, no bleeding All other systems reviewed and are negative.   PHYSICAL EXAM:   VS:  BP 136/88 mmHg  Pulse 91  Ht 5\' 4"  (1.626 m)  Wt 149 lb 3.2 oz (67.677 kg)  BMI 25.60 kg/m2  LMP 10/22/2012   GEN: Well nourished, well developed, in no acute distress HEENT: normal Neck: no JVD, carotid bruits, or masses Cardiac: RRR;  no murmurs, rubs, or gallops,no edema  Respiratory:  clear to auscultation bilaterally, normal work of breathing GI: soft, nontender, nondistended, + BS MS: no deformity or atrophy Skin: warm and dry, no rash Neuro:  Alert and Oriented x 3, Strength and sensation are intact Psych: euthymic mood, full affect, mildly anxious at first, now calm  Wt Readings from Last 3 Encounters:  12/29/15 149 lb 3.2 oz (67.677 kg)  08/22/15 147 lb 12.8 oz (67.042 kg)  12/24/14 139 lb (63.05 kg)      Studies/Labs Reviewed:   EKG:  EKG is ordered today.  The ekg ordered today demonstrates Sinus rhythm, sinus arrhythmia, heart rate 91, no ST segment changes, QTC 479 however QT was 390 ms.  Recent Labs: 05/27/2015: ALT 17; BUN 16; Creatinine, Ser 0.91; Hemoglobin 14.3; Platelets 198.0; Potassium 4.1; Sodium 139; TSH 1.42   Lipid Panel    Component Value Date/Time   CHOL 194 05/27/2015 0914   TRIG 44.0 05/27/2015 0914   HDL 75.30 05/27/2015 0914   CHOLHDL 3 05/27/2015 0914   VLDL 8.8 05/27/2015 0914   LDLCALC 109* 05/27/2015 0914    Additional studies/ records that were reviewed today include:  LDL 109, creatinine 0.9 hemoglobin 14.3, prior office notes reviewed    ASSESSMENT:    1. Chest pain, unspecified chest pain type   2. Palpitations   3. Anxiety      PLAN:  In order of problems listed above  Chest pain -Her exercise started early this morning at 5 AM, it is currently approximate 10 AM. I will go ahead and check a troponin I to ensure that she does not have any signs of myocardial injury. -I will also set her up for an exercise treadmill test to ensure safety during exercise. -There is no evidence of ischemia on current baseline EKG. -If troponin is abnormal, we will admit her to the hospital for further workup.  Palpitations -Sinus rhythm with minor sinus arrhythmia seen on EKG. No evidence of atrial relation, reassuring. -Prior lateral lites were normal.  Anxiety -Maybe  playing a role in her symptoms however sensation of squeezing in her chest and we will check a troponin to ensure that there is no signs of myocardial injury. -Currently at the end of this encounter, she is feeling better.  I discussed with her permission with Kirk Ruths.  Medication Adjustments/Labs and Tests Ordered: Current medicines are reviewed at length with the patient today.  Concerns regarding medicines are outlined above.  Medication changes, Labs and Tests ordered today are listed in the Patient Instructions below. Patient Instructions  Medication Instructions:  The current medical regimen is effective;  continue present plan and medications.  Labwork: Please have blood work today.  Testing/Procedures:  Your physician has requested that you have an exercise tolerance test. For further information please visit [email or web address]. Please also follow instruction sheet, as given.  Follow-Up: Further follow up will be based on these results.   If you need a refill on your cardiac medications before your next appointment, please call your pharmacy.  Thank you for choosing [facility]!!           Signed, Candee Furbish, MD  12/29/2015 9:43 AM    [facility] [place], [place], [place]  [address] Phone: [phone]; Fax: [phone]

## 2015-12-29 NOTE — Patient Instructions (Signed)
Medication Instructions:  The current medical regimen is effective;  continue present plan and medications.  Labwork: Please have blood work today.  Testing/Procedures: Your physician has requested that you have an exercise tolerance test. For further information please visit [email or web address]. Please also follow instruction sheet, as given.  Follow-Up: Further follow up will be based on these results.   If you need a refill on your cardiac medications before your next appointment, please call your pharmacy.  Thank you for choosing [facility]!!

## 2016-01-04 ENCOUNTER — Ambulatory Visit (INDEPENDENT_AMBULATORY_CARE_PROVIDER_SITE_OTHER): Payer: 59

## 2016-01-04 DIAGNOSIS — R002 Palpitations: Secondary | ICD-10-CM

## 2016-01-04 DIAGNOSIS — R079 Chest pain, unspecified: Secondary | ICD-10-CM

## 2016-01-04 LAB — EXERCISE TOLERANCE TEST
CHL CUP RESTING HR STRESS: 75 {beats}/min
CHL CUP STRESS STAGE 1 DBP: 87 mmHg
CHL CUP STRESS STAGE 1 SPEED: 0 mph
CHL CUP STRESS STAGE 2 HR: 93 {beats}/min
CHL CUP STRESS STAGE 3 GRADE: 0 %
CHL CUP STRESS STAGE 3 HR: 94 {beats}/min
CHL CUP STRESS STAGE 3 SPEED: 1 mph
CHL CUP STRESS STAGE 4 DBP: 94 mmHg
CHL CUP STRESS STAGE 4 HR: 120 {beats}/min
CHL CUP STRESS STAGE 4 SBP: 192 mmHg
CHL CUP STRESS STAGE 6 DBP: 92 mmHg
CHL CUP STRESS STAGE 6 GRADE: 14 %
CHL CUP STRESS STAGE 6 SBP: 208 mmHg
CHL CUP STRESS STAGE 6 SPEED: 3.4 mph
CHL CUP STRESS STAGE 7 GRADE: 16 %
CHL CUP STRESS STAGE 7 HR: 171 {beats}/min
CHL CUP STRESS STAGE 7 SPEED: 4.2 mph
CHL CUP STRESS STAGE 8 SBP: 199 mmHg
CHL CUP STRESS STAGE 9 DBP: 86 mmHg
CHL CUP STRESS STAGE 9 GRADE: 0 %
CHL CUP STRESS STAGE 9 HR: 97 {beats}/min
CHL CUP STRESS STAGE 9 SBP: 155 mmHg
CHL RATE OF PERCEIVED EXERTION: 16
CSEPED: 10 min
CSEPEDS: 0 s
CSEPHR: 101 %
CSEPPMHR: 101 %
Estimated workload: 11.7 METS
MPHR: 168 {beats}/min
Peak HR: 171 {beats}/min
Stage 1 Grade: 0 %
Stage 1 HR: 88 {beats}/min
Stage 1 SBP: 160 mmHg
Stage 2 Grade: 0 %
Stage 2 Speed: 0.9 mph
Stage 4 Grade: 10 %
Stage 4 Speed: 1.7 mph
Stage 5 DBP: 96 mmHg
Stage 5 Grade: 12 %
Stage 5 HR: 137 {beats}/min
Stage 5 SBP: 198 mmHg
Stage 5 Speed: 2.5 mph
Stage 6 HR: 157 {beats}/min
Stage 8 DBP: 88 mmHg
Stage 8 Grade: 0 %
Stage 8 HR: 146 {beats}/min
Stage 8 Speed: 1.5 mph
Stage 9 Speed: 0 mph

## 2016-01-23 ENCOUNTER — Ambulatory Visit: Payer: 59 | Admitting: Cardiology

## 2016-03-07 DIAGNOSIS — H66002 Acute suppurative otitis media without spontaneous rupture of ear drum, left ear: Secondary | ICD-10-CM | POA: Diagnosis not present

## 2016-03-07 MED FILL — AZITHROMYCIN 500 MG TABLET: 500 | 3 days supply | Qty: 3 | Fill #0

## 2016-06-29 DIAGNOSIS — Z1231 Encounter for screening mammogram for malignant neoplasm of breast: Secondary | ICD-10-CM | POA: Diagnosis not present

## 2016-07-29 DIAGNOSIS — J0140 Acute pansinusitis, unspecified: Secondary | ICD-10-CM | POA: Diagnosis not present

## 2016-07-29 DIAGNOSIS — J209 Acute bronchitis, unspecified: Secondary | ICD-10-CM | POA: Diagnosis not present

## 2016-08-01 DIAGNOSIS — Z01419 Encounter for gynecological examination (general) (routine) without abnormal findings: Secondary | ICD-10-CM | POA: Diagnosis not present

## 2016-08-01 DIAGNOSIS — Z6825 Body mass index (BMI) 25.0-25.9, adult: Secondary | ICD-10-CM | POA: Diagnosis not present

## 2016-08-24 DIAGNOSIS — R030 Elevated blood-pressure reading, without diagnosis of hypertension: Secondary | ICD-10-CM | POA: Diagnosis not present

## 2016-08-24 DIAGNOSIS — R05 Cough: Secondary | ICD-10-CM | POA: Diagnosis not present

## 2016-08-24 DIAGNOSIS — Z23 Encounter for immunization: Secondary | ICD-10-CM | POA: Diagnosis not present

## 2016-08-24 DIAGNOSIS — J018 Other acute sinusitis: Secondary | ICD-10-CM | POA: Diagnosis not present

## 2017-01-11 DIAGNOSIS — H5213 Myopia, bilateral: Secondary | ICD-10-CM | POA: Diagnosis not present

## 2017-01-16 ENCOUNTER — Encounter: Payer: Self-pay | Admitting: Family Medicine

## 2017-01-16 ENCOUNTER — Ambulatory Visit (INDEPENDENT_AMBULATORY_CARE_PROVIDER_SITE_OTHER): Payer: 59 | Admitting: Family Medicine

## 2017-01-16 VITALS — BP 138/86 | HR 79 | Temp 97.8°F | Ht 64.0 in | Wt 149.6 lb

## 2017-01-16 DIAGNOSIS — R030 Elevated blood-pressure reading, without diagnosis of hypertension: Secondary | ICD-10-CM | POA: Insufficient documentation

## 2017-01-16 DIAGNOSIS — E559 Vitamin D deficiency, unspecified: Secondary | ICD-10-CM | POA: Diagnosis not present

## 2017-01-16 DIAGNOSIS — E041 Nontoxic single thyroid nodule: Secondary | ICD-10-CM

## 2017-01-16 DIAGNOSIS — Z0000 Encounter for general adult medical examination without abnormal findings: Secondary | ICD-10-CM

## 2017-01-16 DIAGNOSIS — Z13220 Encounter for screening for lipoid disorders: Secondary | ICD-10-CM | POA: Diagnosis not present

## 2017-01-16 DIAGNOSIS — J301 Allergic rhinitis due to pollen: Secondary | ICD-10-CM | POA: Diagnosis not present

## 2017-01-16 DIAGNOSIS — R7989 Other specified abnormal findings of blood chemistry: Secondary | ICD-10-CM

## 2017-01-16 LAB — CBC WITH DIFFERENTIAL/PLATELET
Basophils Absolute: 0 10*3/uL (ref 0.0–0.1)
Basophils Relative: 1 % (ref 0.0–3.0)
Eosinophils Absolute: 0.1 10*3/uL (ref 0.0–0.7)
Eosinophils Relative: 1.9 % (ref 0.0–5.0)
HCT: 42.9 % (ref 36.0–46.0)
Hemoglobin: 14.3 g/dL (ref 12.0–15.0)
Lymphocytes Relative: 28.3 % (ref 12.0–46.0)
Lymphs Abs: 1.4 10*3/uL (ref 0.7–4.0)
MCHC: 33.3 g/dL (ref 30.0–36.0)
MCV: 84.4 fl (ref 78.0–100.0)
Monocytes Absolute: 0.4 10*3/uL (ref 0.1–1.0)
Monocytes Relative: 8.7 % (ref 3.0–12.0)
Neutro Abs: 3 10*3/uL (ref 1.4–7.7)
Neutrophils Relative %: 60.1 % (ref 43.0–77.0)
Platelets: 228 10*3/uL (ref 150.0–400.0)
RBC: 5.08 Mil/uL (ref 3.87–5.11)
RDW: 13.5 % (ref 11.5–15.5)
WBC: 5 10*3/uL (ref 4.0–10.5)

## 2017-01-16 LAB — COMPREHENSIVE METABOLIC PANEL
ALT: 18 U/L (ref 0–35)
AST: 23 U/L (ref 0–37)
Albumin: 4.7 g/dL (ref 3.5–5.2)
Alkaline Phosphatase: 82 U/L (ref 39–117)
BUN: 18 mg/dL (ref 6–23)
CO2: 28 mEq/L (ref 19–32)
Calcium: 9.7 mg/dL (ref 8.4–10.5)
Chloride: 102 mEq/L (ref 96–112)
Creatinine, Ser: 0.9 mg/dL (ref 0.40–1.20)
GFR: 69.37 mL/min (ref >60.00)
Glucose, Bld: 89 mg/dL (ref 70–99)
Potassium: 4.2 mEq/L (ref 3.5–5.1)
Sodium: 138 mEq/L (ref 135–145)
Total Bilirubin: 0.4 mg/dL (ref 0.2–1.2)
Total Protein: 7.5 g/dL (ref 6.0–8.3)

## 2017-01-16 LAB — LIPID PANEL
Cholesterol: 216 mg/dL — ABNORMAL HIGH (ref 0–200)
HDL: 75.3 mg/dL (ref >39.00)
LDL Cholesterol: 132 mg/dL — ABNORMAL HIGH (ref 0–99)
NonHDL: 140.72
Total CHOL/HDL Ratio: 3
Triglycerides: 45 mg/dL (ref 0.0–149.0)
VLDL: 9 mg/dL (ref 0.0–40.0)

## 2017-01-16 LAB — T4, FREE: Free T4: 0.91 ng/dL (ref 0.60–1.60)

## 2017-01-16 LAB — VITAMIN D 25 HYDROXY (VIT D DEFICIENCY, FRACTURES): VITD: 28.46 ng/mL — ABNORMAL LOW (ref 30.00–100.00)

## 2017-01-16 LAB — TSH: TSH: 1.41 u[IU]/mL (ref 0.35–4.50)

## 2017-01-16 NOTE — Patient Instructions (Signed)
It was my pleasure to see you today!  Please contact us if you have any questions or concerns.   Jazon Jipson, D.O. [facility]  

## 2017-01-16 NOTE — Progress Notes (Signed)
Gabrielle Hernandez is a 56 y.o. female is here to [facility].   History of Present Illness:   Shaune Pascal CMA acting as scribe for Dr. Juleen China  HPI:  1. Elevated BP without diagnosis of hypertension. Home blood pressure readings elevated. Avoiding excessive salt intake. Trying to exercise on a regular basis. Denies chest pain. Taking medications as prescribed without side effects.   Wt Readings from Last 3 Encounters:  01/16/17 149 lb 9.6 oz (67.9 kg)  12/29/15 149 lb 3.2 oz (67.7 kg)  08/22/15 147 lb 12.8 oz (67 kg)   She started smoking about 27 years ago.   BP Readings from Last 3 Encounters:  01/16/17 138/86  12/29/15 136/88  08/22/15 120/80   Lab Results  Component Value Date   CREATININE 0.91 05/27/2015    2. Thyroid nodule. Hx of thyromegaly with multiple nodules. Last [place] in [facility] 2016. Followed by Endocrine and due for visit. Previous TSH WNL.    There are no preventive care reminders to display for this patient.  PMHx, SurgHx, SocialHx, Medications, and Allergies were reviewed in the Visit Navigator and updated as appropriate.   Past Medical History:  Diagnosis Date  . Gallstones 1993  . GERD (gastroesophageal reflux disease)   . Multinodular goiter 2009   Right thyroid nodule. Negative FNA. Followed by Endocrine.  . Seasonal allergies   . Superficial basal cell carcinoma on right shoulder 2011   Past Surgical History:  Procedure Laterality Date  . CHOLECYSTECTOMY  1993  . DILATION AND CURETTAGE OF UTERUS    . ESOPHAGOGASTRODUODENOSCOPY    . THYROID SURGERY  7/11  . TUBAL LIGATION  1996   Family History  Problem Relation Age of Onset  . Hypertension Mother   . Hypertension Brother   . Heart disease Maternal Grandfather   . Parkinson's disease Maternal Grandfather   . Stomach cancer Paternal Grandmother   . Colon cancer Neg Hx   . Coronary artery disease Maternal Grandfather   . Kidney disease Neg Hx   . Gallbladder disease Neg Hx   .  Esophageal cancer Neg Hx    Social History  Substance Use Topics  . Smoking status: Former Smoker    Start date: 11/13/1989  . Smokeless tobacco: Never Used  . Alcohol use 0.0 oz/week     Comment: 1 glass of wine a night   Current Medications and Allergies:   Current Outpatient Prescriptions:  .  ALPRAZolam (XANAX) 0.25 MG tablet, Take 0.25 mg by mouth at bedtime as needed for anxiety (patient takes 1/2 as needed for anxiety)., Disp: , Rfl:  .  triamcinolone (NASACORT ALLERGY 24HR) 55 MCG/ACT AERO nasal inhaler, Place 2 sprays into the nose daily., Disp: , Rfl:   Allergies  Allergen Reactions  . Latex Itching    Pt gets itchy with contact with latex   Review of Systems:   Review of Systems  Constitutional: Negative for chills, fever and malaise/fatigue.  HENT: Negative for congestion, ear pain, sinus pain and sore throat.   Eyes: Negative for blurred vision and double vision.  Respiratory: Negative for cough, shortness of breath and wheezing.   Cardiovascular: Negative for chest pain, palpitations and leg swelling.  Gastrointestinal: Negative for abdominal pain, constipation and diarrhea.  Genitourinary: Negative for dysuria.  Musculoskeletal: Negative for neck pain.  Neurological: Negative for dizziness and headaches.  Psychiatric/Behavioral: Negative for depression, hallucinations and memory loss.   Vitals:   Vitals:   01/16/17 0742  BP: 138/86  Pulse:  79  Temp: 97.8 F (36.6 C)  TempSrc: Oral  SpO2: 97%  Weight: 149 lb 9.6 oz (67.9 kg)  Height: 5\' 4"  (1.626 m)     Body mass index is 25.68 kg/m.  Physical Exam:   Physical Exam  Constitutional: She appears well-developed and well-nourished. No distress.  HENT:  Head: Normocephalic and atraumatic.  Right Ear: External ear normal.  Left Ear: External ear normal.  Nose: Nose normal.  Mouth/Throat: Oropharynx is clear and moist.  Eyes: Conjunctivae and EOM are normal. Pupils are equal, round, and reactive to  light.  Neck: Normal range of motion. Neck supple.  Cardiovascular: Normal rate, regular rhythm, normal heart sounds and intact distal pulses.   Pulmonary/Chest: Effort normal and breath sounds normal.  Abdominal: Soft.  Musculoskeletal: Normal range of motion.  Neurological: She is alert.  Skin: Skin is warm.  Psychiatric: She has a normal mood and affect. Her behavior is normal.  Nursing note and vitals reviewed.    Assessment and Plan:    Mckayla was seen today for establish care and hypertension.  Diagnoses and all orders for this visit:  Routine physical examination -     CBC with Differential/Platelet -     Comprehensive metabolic panel  Screening for lipid disorders -     Lipid panel  Elevated BP without diagnosis of hypertension Comments: High normal. Elevated at home. She will continue to monitor. Patient is not wanting to start a medication today if she can avoid it. Will begin treatment if risks elevated with lab results.  Thyroid nodule -     T4, free -     TSH  Low vitamin D level -     VITAMIN D 25 Hydroxy (Vit-D Deficiency, Fractures)   . Reviewed expectations re: course of current medical issues. . Discussed self-management of symptoms. . Outlined signs and symptoms indicating need for more acute intervention. . Patient verbalized understanding and all questions were answered. . See orders for this visit as documented in the electronic medical record. . Patient received an After Visit Summary.  Records requested if needed. I spent 30 minutes with this patient, greater than 50% was face-to-face time counseling regarding the above diagnoses.  CMA served as Education administrator during this visit. History, Physical, and Plan performed by medical provider. Documentation and orders reviewed and attested to. Briscoe Deutscher, D.O.  Briscoe Deutscher, [facility] [place] 01/16/2017  Follow-up: No Follow-up on file.  No orders of the defined types were placed in this  encounter.  There are no discontinued medications. Orders Placed This Encounter  Procedures  . CBC with Differential/Platelet  . Comprehensive metabolic panel  . T4, free  . TSH  . VITAMIN D 25 Hydroxy (Vit-D Deficiency, Fractures)  . Lipid panel

## 2017-01-16 NOTE — Progress Notes (Signed)
Pre visit review using our clinic review tool, if applicable. No additional management support is needed unless otherwise documented below in the visit note. 

## 2017-01-18 DIAGNOSIS — E042 Nontoxic multinodular goiter: Secondary | ICD-10-CM | POA: Diagnosis not present

## 2017-02-05 DIAGNOSIS — L603 Nail dystrophy: Secondary | ICD-10-CM | POA: Diagnosis not present

## 2017-02-05 DIAGNOSIS — L814 Other melanin hyperpigmentation: Secondary | ICD-10-CM | POA: Diagnosis not present

## 2017-02-05 DIAGNOSIS — D1801 Hemangioma of skin and subcutaneous tissue: Secondary | ICD-10-CM | POA: Diagnosis not present

## 2017-02-05 DIAGNOSIS — D2272 Melanocytic nevi of left lower limb, including hip: Secondary | ICD-10-CM | POA: Diagnosis not present

## 2017-02-05 DIAGNOSIS — D225 Melanocytic nevi of trunk: Secondary | ICD-10-CM | POA: Diagnosis not present

## 2017-02-05 DIAGNOSIS — D2362 Other benign neoplasm of skin of left upper limb, including shoulder: Secondary | ICD-10-CM | POA: Diagnosis not present

## 2017-02-05 DIAGNOSIS — D2271 Melanocytic nevi of right lower limb, including hip: Secondary | ICD-10-CM | POA: Diagnosis not present

## 2017-02-05 DIAGNOSIS — L918 Other hypertrophic disorders of the skin: Secondary | ICD-10-CM | POA: Diagnosis not present

## 2017-02-05 DIAGNOSIS — L821 Other seborrheic keratosis: Secondary | ICD-10-CM | POA: Diagnosis not present

## 2017-03-05 ENCOUNTER — Encounter: Payer: Self-pay | Admitting: Obstetrics & Gynecology

## 2017-03-05 ENCOUNTER — Ambulatory Visit (INDEPENDENT_AMBULATORY_CARE_PROVIDER_SITE_OTHER): Payer: 59 | Admitting: Obstetrics & Gynecology

## 2017-03-05 VITALS — BP 136/80 | Wt 150.0 lb

## 2017-03-05 DIAGNOSIS — N951 Menopausal and female climacteric states: Secondary | ICD-10-CM | POA: Diagnosis not present

## 2017-03-05 MED ORDER — ESTRADIOL 0.05 MG/24HR TD PTWK: 0.0500 mg | MEDICATED_PATCH | TRANSDERMAL | 4 refills | Status: DC

## 2017-03-05 MED ORDER — PROGESTERONE MICRONIZED 100 MG PO CAPS: 100.0000 mg | ORAL_CAPSULE | Freq: Every day | ORAL | 4 refills | Status: DC

## 2017-03-05 MED FILL — ESTRADIOL 0.05 MG/DAY PATCH: 0.05 | 84 days supply | Qty: 12 | Fill #0

## 2017-03-05 MED FILL — PROGESTERONE 100 MG CAPSULE: 100 | 90 days supply | Qty: 90 | Fill #0

## 2017-03-05 NOTE — Patient Instructions (Signed)
1. Menopause syndrome Severe Menopausal Sxs interfering with daily activities and enjoyment of her life.  Menopausal Sxs causing insomnia and fatigue.  No depression.  Vaginal dryness and painful IC.  No CI to HRT.  Risks and benefits on HRT thoroughly discussed including strokes/Breast Ca/Increased BP in terms of risks and Control of Menopausal Sxs/Bone protection/Skin improvement including vaginal/vulvar areas.  Decision to start on Estradiol patch 0.05 qweek with Micronized Progesterone 100 mg HS.  Usage reviewed.  Gabrielle Hernandez, it was a pleasure to see you today!  Please let me know if you feel that the Hormone Replacement Therapy needs to be adjusted after 3-4 weeks on it or if a local vaginal treatment needs to be added.  If all is good, I'll see you for your Gyn/Annual exam in the fall.  :)

## 2017-03-05 NOTE — Progress Notes (Signed)
    Gabrielle Hernandez September 13, 1961 [id number]        56 y.o.  G2P2 married.  S/P BT/S  RP:  Menopausal symptoms with severe Hot flushes, discuss HRT  HPI:  Fully in menopause x at least 2 years.  Used PhytoEstrogen only so far, but hot flushes and night sweats worsening at this time.  Difficulty sleeping, more and more tired.  Also dealing with vaginal dryness causing painful IC.  Would like to start on HRT.  No PMB.  No pelvic pain.  No depressive Sx.  Mammo neg 07/2016 per patient.  Annual/gyn exam also normal 07/2016.  Past medical history,surgical history, problem list, medications, allergies, family history and social history were all reviewed and documented in the EPIC chart.  Directed ROS with pertinent positives and negatives documented in the history of present illness/assessment and plan.  Exam:  Vitals:   03/05/17 1002  BP: 136/80  Weight: 150 lb (68 kg)   General appearance:  Normal Deferred  Assessment/Plan:  55 y.o. G2P2   1. Menopause syndrome Severe Menopausal Sxs interfering with daily activities and enjoyment of her life.  Menopausal Sxs causing insomnia and fatigue.  No depression.  Vaginal dryness and painful IC.  No CI to HRT.  Risks and benefits on HRT thoroughly discussed including strokes/Breast Ca/Increased BP in terms of risks and Control of Menopausal Sxs/Bone protection/Skin improvement including vaginal/vulvar areas.  Decision to start on Estradiol patch 0.05 qweek with Micronized Progesterone 100 mg HS.  Usage reviewed.  Counseling >50% x 25 minutes on above issue.  Princess Bruins MD, 10:07 AM 03/05/2017

## 2017-05-13 MED FILL — ESTRADIOL 0.05 MG/DAY PATCH: 0.05 | 84 days supply | Qty: 12 | Fill #1

## 2017-05-17 ENCOUNTER — Telehealth: Payer: Self-pay | Admitting: *Deleted

## 2017-05-17 NOTE — Telephone Encounter (Signed)
Pt takes estradiol 0.5 mg patch, progesterone capsules 100 mg at bedtime, pt called today stating she noticed light spotting, states she did miss a dose while on vacation due to time different, no cramping, I told pt not abnormal to have some light spotting with progesterone, if bleeding should increase to call office. Pt verbalized she understood.

## 2017-05-28 MED FILL — PROGESTERONE 100 MG CAPSULE: 100 | 90 days supply | Qty: 90 | Fill #1

## 2017-07-11 ENCOUNTER — Telehealth: Payer: Self-pay | Admitting: *Deleted

## 2017-07-11 NOTE — Telephone Encounter (Signed)
Pt takes climara patch 0.05mg  weekly, works out a lot states patches falls off due to sweating. Asked if she could switch to smaller patch such as vivelle dot? Please advise

## 2017-07-14 NOTE — Telephone Encounter (Signed)
Sure, same dose Vivelle Dot.

## 2017-07-15 MED ORDER — ESTRADIOL 0.05 MG/24HR TD PTTW: 1.0000 | MEDICATED_PATCH | TRANSDERMAL | 2 refills | Status: DC

## 2017-07-15 MED FILL — ESTRADIOL 0.05 MG PATCH: 0.05 | 28 days supply | Qty: 8 | Fill #0

## 2017-07-15 NOTE — Telephone Encounter (Signed)
Pt aware, Rx sent. 

## 2017-08-16 ENCOUNTER — Encounter: Payer: Self-pay | Admitting: Obstetrics & Gynecology

## 2017-08-16 ENCOUNTER — Ambulatory Visit (INDEPENDENT_AMBULATORY_CARE_PROVIDER_SITE_OTHER): Payer: 59 | Admitting: Obstetrics & Gynecology

## 2017-08-16 VITALS — BP 122/76 | Ht 64.0 in | Wt 149.0 lb

## 2017-08-16 DIAGNOSIS — Z01419 Encounter for gynecological examination (general) (routine) without abnormal findings: Secondary | ICD-10-CM

## 2017-08-16 DIAGNOSIS — N950 Postmenopausal bleeding: Secondary | ICD-10-CM

## 2017-08-16 DIAGNOSIS — R87618 Other abnormal cytological findings on specimens from cervix uteri: Secondary | ICD-10-CM | POA: Diagnosis not present

## 2017-08-16 DIAGNOSIS — Z0000 Encounter for general adult medical examination without abnormal findings: Secondary | ICD-10-CM | POA: Diagnosis not present

## 2017-08-16 DIAGNOSIS — Z780 Asymptomatic menopausal state: Secondary | ICD-10-CM

## 2017-08-16 DIAGNOSIS — Z23 Encounter for immunization: Secondary | ICD-10-CM

## 2017-08-16 NOTE — Progress Notes (Signed)
Gabrielle Hernandez 1961/03/12 [id number]   History:    56 y.o. G2P2L2 Married.  S/P Tubal Ligation  RP:  Established patient presenting for annual gyn exam   HPI:  Menopause on HRT with Estradiol 0.05 patch and Prometrium 100 mg HS x 03/05/2017.  Since then, had mild vaginal bleeding every month x 5 days.  No hot flushes or night sweats at all.  Patch was not sticking very well, so switched to Vivelle dot 2 weeks ago which is sticking very well.  No pelvic pain.  Breasts wnl.  Urine/BMs wnl.  BMI 25.58.  Past medical history,surgical history, family history and social history were all reviewed and documented in the EPIC chart.  Gynecologic History Patient's last menstrual period was 10/22/2012. Contraception: tubal ligation Last Pap: 2016. Results were: normal Last mammogram: 2017. Results were: normal.  Scheduled for next Monday [place] 2016 Bone Density will start next year  Obstetric History OB History  Gravida Para Term Preterm AB Living  2 2       2   SAB TAB Ectopic Multiple Live Births               # Outcome Date GA Lbr Len/2nd Weight Sex Delivery Anes PTL Lv  2 Para           1 Para                ROS: A ROS was performed and pertinent positives and negatives are included in the history.  GENERAL: No fevers or chills. HEENT: No change in vision, no earache, sore throat or sinus congestion. NECK: No pain or stiffness. CARDIOVASCULAR: No chest pain or pressure. No palpitations. PULMONARY: No shortness of breath, cough or wheeze. GASTROINTESTINAL: No abdominal pain, nausea, vomiting or diarrhea, melena or bright red blood per rectum. GENITOURINARY: No urinary frequency, urgency, hesitancy or dysuria. MUSCULOSKELETAL: No joint or muscle pain, no back pain, no recent trauma. DERMATOLOGIC: No rash, no itching, no lesions. ENDOCRINE: No polyuria, polydipsia, no heat or cold intolerance. No recent change in weight. HEMATOLOGICAL: No anemia or easy bruising or bleeding. NEUROLOGIC:  No headache, seizures, numbness, tingling or weakness. PSYCHIATRIC: No depression, no loss of interest in normal activity or change in sleep pattern.     Exam:   BP 122/76   Ht 5\' 4"  (1.626 m)   Wt 149 lb (67.6 kg)   LMP 10/22/2012   BMI 25.58 kg/m   Body mass index is 25.58 kg/m.  General appearance : Well developed well nourished female. No acute distress HEENT: Eyes: no retinal hemorrhage or exudates,  Neck supple, trachea midline, no carotid bruits, no thyroidmegaly Lungs: Clear to auscultation, no rhonchi or wheezes, or rib retractions  Heart: Regular rate and rhythm, no murmurs or gallops Breast:Examined in sitting and supine position were symmetrical in appearance, no palpable masses or tenderness,  no skin retraction, no nipple inversion, no nipple discharge, no skin discoloration, no axillary or supraclavicular lymphadenopathy Abdomen: no palpable masses or tenderness, no rebound or guarding Extremities: no edema or skin discoloration or tenderness  Pelvic: Vulva normal  Bartholin, Urethra, Skene Glands: Within normal limits             Vagina: No gross lesions or discharge  Cervix: No gross lesions or discharge.  Pap reflex done.  Uterus  AV, normal size, shape and consistency, non-tender and mobile  Adnexa  Without masses or tenderness  Anus and perineum  normal   Assessment/Plan:  56 y.o. female  for annual exam   1. Encounter for routine gynecological examination with Papanicolaou smear of cervix Normal gynecologic exam.  Pap reflex done.  Breast exam normal.  Colonoscopy 2016.  2. Menopause present Menopausal symptoms controlled on Vivelle dot 0.05 patch and Prometrium 100 mg daily at bedtime, but having postmenopausal bleeding.  Therefore will stop hormone replacement therapy until investigation is completed.  3. Postmenopausal bleeding Stop HRT until investigation with Pelvic US next visit.  Postmenopausal bleeding probably due to hormone replacement therapy,  but rule out endometrial polyp, endometrial hyperplasia or endometrial cancer. - US Transvaginal Non-OB; Future  Counseling on above issues >50% x 10 minutes  Princess Bruins MD, 11:35 AM 08/16/2017

## 2017-08-19 ENCOUNTER — Encounter: Payer: Self-pay | Admitting: Obstetrics & Gynecology

## 2017-08-19 DIAGNOSIS — Z1231 Encounter for screening mammogram for malignant neoplasm of breast: Secondary | ICD-10-CM | POA: Diagnosis not present

## 2017-08-19 NOTE — Patient Instructions (Signed)
1. Encounter for routine gynecological examination with Papanicolaou smear of cervix Normal gynecologic exam.  Pap reflex done.  Breast exam normal.  Colonoscopy 2016.  2. Menopause present Menopausal symptoms controlled on Vivelle dot 0.05 patch and Prometrium 100 mg daily at bedtime, but having postmenopausal bleeding.  Therefore will stop hormone replacement therapy until investigation is completed.  3. Postmenopausal bleeding Stop HRT until investigation with Pelvic US next visit.  Postmenopausal bleeding probably due to hormone replacement therapy, but rule out endometrial polyp, endometrial hyperplasia or endometrial cancer. - US Transvaginal Non-OB; Future  Gabrielle Hernandez, it was a pleasure seeing you today.  I will inform you of your results as soon as available.  Postmenopausal Bleeding Postmenopausal bleeding is any bleeding a woman has after she has entered into menopause. Menopause is the end of a woman's fertile years. After menopause, a woman no longer ovulates or has menstrual periods. Postmenopausal bleeding can be caused by various things. Any type of postmenopausal bleeding, even if it appears to be a typical menstrual period, is concerning. This should be evaluated by your health care provider. Any treatment will depend on the cause of the bleeding. Follow these instructions at home: Monitor your condition for any changes. The following actions may help to alleviate any discomfort you are experiencing:  Avoid the use of tampons and douches as directed by your health care provider.  Change your pads frequently.  Get regular pelvic exams and Pap tests.  Keep all follow-up appointments for diagnostic tests as directed by your health care provider.  Contact a health care provider if:  Your bleeding lasts more than 1 week.  You have abdominal pain.  You have bleeding with sexual intercourse. Get help right away if:  You have a fever, chills, headache, dizziness, muscle aches,  and bleeding.  You have severe pain with bleeding.  You are passing blood clots.  You have bleeding and need more than 1 pad an hour.  You feel faint. This information is not intended to replace advice given to you by your health care provider. Make sure you discuss any questions you have with your health care provider. Document Released: 01/02/2006 Document Revised: 03/01/2016 Document Reviewed: 04/23/2013 Elsevier Interactive Patient Education  [employer].

## 2017-08-20 LAB — PAP IG W/ RFLX HPV ASCU

## 2017-08-23 ENCOUNTER — Encounter: Payer: Self-pay | Admitting: *Deleted

## 2017-09-04 ENCOUNTER — Encounter: Payer: Self-pay | Admitting: Obstetrics & Gynecology

## 2017-09-04 ENCOUNTER — Ambulatory Visit (INDEPENDENT_AMBULATORY_CARE_PROVIDER_SITE_OTHER): Payer: 59

## 2017-09-04 ENCOUNTER — Ambulatory Visit: Payer: 59 | Admitting: Obstetrics & Gynecology

## 2017-09-04 VITALS — BP 162/94

## 2017-09-04 DIAGNOSIS — N938 Other specified abnormal uterine and vaginal bleeding: Secondary | ICD-10-CM | POA: Diagnosis not present

## 2017-09-04 DIAGNOSIS — N854 Malposition of uterus: Secondary | ICD-10-CM | POA: Diagnosis not present

## 2017-09-04 DIAGNOSIS — N950 Postmenopausal bleeding: Secondary | ICD-10-CM | POA: Diagnosis not present

## 2017-09-04 DIAGNOSIS — Z79890 Hormone replacement therapy (postmenopausal): Secondary | ICD-10-CM | POA: Diagnosis not present

## 2017-09-04 NOTE — Progress Notes (Signed)
    Gabrielle Hernandez 06-03-1961 [id number]        56 y.o.  G2P2   RP:  PMB on HRT for Pelvic US  HPI:  No change x visit on 08/16/2017:  Menopause on HRT with Estradiol 0.05 patch and Prometrium 100 mg HS x 03/05/2017.  Since then, had mild vaginal bleeding every month x 5 days.  No hot flushes or night sweats at all.  Patch was not sticking very well, so switched to Vivelle dot 2 weeks ago which is sticking very well.  No pelvic pain.  Breasts wnl.  Urine/BMs wnl.  BMI 25.58.  Past medical history,surgical history, problem list, medications, allergies, family history and social history were all reviewed and documented in the EPIC chart.  Directed ROS with pertinent positives and negatives documented in the history of present illness/assessment and plan.  Exam:  Vitals:   09/04/17 1032  BP: (!) 162/94   General appearance:  Normal  Pelvic US today: T/V uterus anteverted and homogeneous measuring 7.13 x 4.82 x 3.26 cm.  Endometrial lining is thin and normal at 2.0 mm.  Right and left ovary normal.  No apparent mass in the right and left adnexa.  No free fluid in posterior cul-de-sac.   Assessment/Plan:  56 y.o. G2P2   1. Postmenopausal HRT (hormone replacement therapy) Thin Endometrial line at 2.0 mm.  Reassured.  No contraindication to hormone replacement therapy.  Will restart Vivelle dot/Prometrium continuously at the same dosage.  Counseling on above issues more than 50% for 15 minutes.  Princess Bruins MD, 10:49 AM 09/04/2017

## 2017-09-07 ENCOUNTER — Encounter: Payer: Self-pay | Admitting: Obstetrics & Gynecology

## 2017-09-07 NOTE — Patient Instructions (Signed)
1. Postmenopausal HRT (hormone replacement therapy) Thin Endometrial line at 2.0 mm.  Reassured.  No contraindication to hormone replacement therapy.  Will restart Vivelle dot/Prometrium continuously at the same dosage.  Gabrielle Hernandez, it was a pleasure seeing you today!

## 2017-09-10 MED FILL — PROGESTERONE 100 MG CAPSULE: 100 | 90 days supply | Qty: 90 | Fill #2

## 2017-09-10 MED FILL — ESTRADIOL 0.05 MG PATCH: 0.05 | 28 days supply | Qty: 8 | Fill #1

## 2017-10-16 MED FILL — ESTRADIOL 0.05 MG PATCH: 0.05 | 28 days supply | Qty: 8 | Fill #2

## 2017-11-14 MED FILL — ESTRADIOL 0.05 MG PATCH: 0.05 | 84 days supply | Qty: 24 | Fill #3

## 2017-12-18 MED FILL — PROGESTERONE 100 MG CAPSULE: 100 | 90 days supply | Qty: 90 | Fill #3

## 2018-01-17 DIAGNOSIS — J301 Allergic rhinitis due to pollen: Secondary | ICD-10-CM | POA: Diagnosis not present

## 2018-01-17 DIAGNOSIS — H9201 Otalgia, right ear: Secondary | ICD-10-CM | POA: Diagnosis not present

## 2018-01-28 MED FILL — ESTRADIOL 0.05 MG PATCH: 0.05 | 84 days supply | Qty: 24 | Fill #4

## 2018-03-03 MED FILL — PROGESTERONE 100 MG CAPSULE: 100 | 90 days supply | Qty: 90 | Fill #4

## 2018-04-09 DIAGNOSIS — L821 Other seborrheic keratosis: Secondary | ICD-10-CM | POA: Diagnosis not present

## 2018-04-09 DIAGNOSIS — D2362 Other benign neoplasm of skin of left upper limb, including shoulder: Secondary | ICD-10-CM | POA: Diagnosis not present

## 2018-04-09 DIAGNOSIS — D2261 Melanocytic nevi of right upper limb, including shoulder: Secondary | ICD-10-CM | POA: Diagnosis not present

## 2018-04-09 DIAGNOSIS — D2271 Melanocytic nevi of right lower limb, including hip: Secondary | ICD-10-CM | POA: Diagnosis not present

## 2018-04-09 DIAGNOSIS — L813 Cafe au lait spots: Secondary | ICD-10-CM | POA: Diagnosis not present

## 2018-04-09 DIAGNOSIS — D225 Melanocytic nevi of trunk: Secondary | ICD-10-CM | POA: Diagnosis not present

## 2018-04-09 DIAGNOSIS — L814 Other melanin hyperpigmentation: Secondary | ICD-10-CM | POA: Diagnosis not present

## 2018-04-09 DIAGNOSIS — D1801 Hemangioma of skin and subcutaneous tissue: Secondary | ICD-10-CM | POA: Diagnosis not present

## 2018-04-16 DIAGNOSIS — H524 Presbyopia: Secondary | ICD-10-CM | POA: Diagnosis not present

## 2018-04-21 ENCOUNTER — Other Ambulatory Visit: Payer: Self-pay | Admitting: Obstetrics & Gynecology

## 2018-04-21 MED FILL — ESTRADIOL 0.05 MG PATCH: 0.05 | 84 days supply | Qty: 24 | Fill #0

## 2018-04-23 ENCOUNTER — Ambulatory Visit (INDEPENDENT_AMBULATORY_CARE_PROVIDER_SITE_OTHER): Payer: 59 | Admitting: Sports Medicine

## 2018-04-23 VITALS — BP 130/90 | Ht 64.0 in | Wt 145.0 lb

## 2018-04-23 DIAGNOSIS — M7712 Lateral epicondylitis, left elbow: Secondary | ICD-10-CM

## 2018-04-23 MED ORDER — MELOXICAM 7.5 MG PO TABS: 7.5000 mg | ORAL_TABLET | Freq: Every day | ORAL | 0 refills | Status: DC

## 2018-04-23 MED ORDER — NITROGLYCERIN 0.2 MG/HR TD PT24: MEDICATED_PATCH | TRANSDERMAL | 0 refills | Status: DC

## 2018-04-23 MED FILL — NITROGLYCERIN 0.2 MG/HR PTC: 0.2 | 28 days supply | Qty: 7 | Fill #0

## 2018-04-23 MED FILL — MELOXICAM 7.5 MG TABLET: 7.5 | 30 days supply | Qty: 30 | Fill #0

## 2018-04-23 NOTE — Patient Instructions (Signed)

## 2018-04-23 NOTE — Progress Notes (Signed)
HPI  CC: Left elbow pain  Patient is a 57 year old female without significant PMH who presents for left elbow pain.  She states the pain is located over the lateral aspect of her left elbow.  She does not remember any trauma to the area, but states the pain started when she began a new work out.  This work out involved high rep, low weight bicep curls.  She states that motion is what made her pain worse.  She switched to doing "hammer" curls, and this relieved the pain somewhat.  She states the pain is made worse with opening jars, and with any rotational motions of her left elbow.  The pain is sharp in quality.  The pain is mostly present during activity, and does not bother her at night time.  The pain seems to be about the same over the past three months.  She has not taken any significant time off from working out during that time period.  She tried ibuprofen, which seemed to make the pain better.  She tried ice and heat, which helped somewhat.  The pain does occasionally radiate to her mid forearm.  She denies any numbness or tingling.  She denies any weakness.  She denies any dizziness, CP, or SOB.  Past Injuries: none  Past Surgeries: none  Smoking: former smoker  Family Hx: non contributory   See HPI above for ROS.  All PMH, medications, and allergies reviewed by myself at today's visit.  Objective: BP 130/90   Ht 5\' 4"  (1.626 m)   Wt 145 lb (65.8 kg)   LMP 10/22/2012   BMI 24.89 kg/m  Gen: Right-Hand Dominant. NAD, well groomed, a/o x3, normal affect.  CV: Well-perfused. Warm.  Resp: Non-labored.  Neuro: Sensation intact throughout. No gross coordination deficits.  Gait: Nonpathologic posture, unremarkable stride without signs of limp or balance issues.  Elbow, left: Patient reports TTP at the lateral epicondyle. Inspection yields no evidence of bony deformity, effusion, erythema, ecchymosis, or rash. Active and passive ROM intact in flexion/extension/supination/pronation.   Strength 5/5 throughout. TTP at the lateral epicondyle. Pain with wrist extension against resistance. No pain with gripping or finger/wrist flexion against resistance. No evidence of pain or laxity at the UCL.  Elbow, right: Inspection yields no evidence of bony deformity, effusion, erythema, ecchymosis, or rash. Active and passive ROM intact in flexion/extension/supination/pronation. Strength 5/5 throughout. No TTP at the medial or lateral epicondyle. No pain with finger/wrist extension against resistance. No pain with gripping or finger/wrist flexion against resistance. No evidence of pain or laxity at the UCL.  Patient is neurovascularly intact.  Bedside US of left elbow: Hypoechoic changes seen in the common extensor tendon bundle at the lateral epicondyle of left elbow in long axis view.  IMPRESSION: findings consistent with lateral epicondylitis.  Assessment and Plan:  Lateral Epicondylitis  Patient with left elbow pain at the lateral epicondyle, with pain in resisted extension.  Bedside US showed hypoechoic changes as noted above, consistent with lateral epicondylitis.  I discussed treatment options with patient today.  She wishes to start nitro patch protocol.  We will bring her back in 30 days to assess progress.  She will likely need 2-3 months of treatment.  Home exercises provided for patient today.  Start meloxicam 7.5 mg daily as well to help with inflammation.  RTC in 30 days.  If no improvement: Consider increasing NSAID dose and continue nitro patch therapy for another month if she can tolerate.  If she  gets acutely worse, consider MRI of the area.  If she develops numbness or tingling with these symptoms, consider radial tunnel as diagnosis.  Lewanda Rife, MD [facility] 04/23/2018 10:20 AM

## 2018-05-18 DIAGNOSIS — M25522 Pain in left elbow: Secondary | ICD-10-CM | POA: Diagnosis not present

## 2018-05-18 DIAGNOSIS — M25511 Pain in right shoulder: Secondary | ICD-10-CM | POA: Diagnosis not present

## 2018-05-22 DIAGNOSIS — M25522 Pain in left elbow: Secondary | ICD-10-CM | POA: Diagnosis not present

## 2018-05-22 DIAGNOSIS — M25511 Pain in right shoulder: Secondary | ICD-10-CM | POA: Diagnosis not present

## 2018-05-26 ENCOUNTER — Other Ambulatory Visit: Payer: Self-pay | Admitting: Internal Medicine

## 2018-05-26 DIAGNOSIS — E042 Nontoxic multinodular goiter: Secondary | ICD-10-CM

## 2018-05-28 ENCOUNTER — Ambulatory Visit: Payer: 59 | Admitting: Sports Medicine

## 2018-05-30 DIAGNOSIS — M25511 Pain in right shoulder: Secondary | ICD-10-CM | POA: Diagnosis not present

## 2018-05-30 DIAGNOSIS — M25522 Pain in left elbow: Secondary | ICD-10-CM | POA: Diagnosis not present

## 2018-06-06 DIAGNOSIS — M25522 Pain in left elbow: Secondary | ICD-10-CM | POA: Diagnosis not present

## 2018-06-06 DIAGNOSIS — M25511 Pain in right shoulder: Secondary | ICD-10-CM | POA: Diagnosis not present

## 2018-06-10 ENCOUNTER — Other Ambulatory Visit: Payer: Self-pay | Admitting: Obstetrics & Gynecology

## 2018-06-10 MED FILL — PROGESTERONE 100 MG CAPSULE: 100 | 90 days supply | Qty: 90 | Fill #0

## 2018-06-13 ENCOUNTER — Other Ambulatory Visit: Payer: 59

## 2018-06-18 DIAGNOSIS — M25522 Pain in left elbow: Secondary | ICD-10-CM | POA: Diagnosis not present

## 2018-06-18 DIAGNOSIS — M25511 Pain in right shoulder: Secondary | ICD-10-CM | POA: Diagnosis not present

## 2018-07-02 DIAGNOSIS — M25522 Pain in left elbow: Secondary | ICD-10-CM | POA: Diagnosis not present

## 2018-07-02 DIAGNOSIS — M25511 Pain in right shoulder: Secondary | ICD-10-CM | POA: Diagnosis not present

## 2018-07-14 MED FILL — ESTRADIOL 0.05 MG PATCH: 0.05 | 84 days supply | Qty: 24 | Fill #1

## 2018-08-06 ENCOUNTER — Ambulatory Visit
Admission: RE | Admit: 2018-08-06 | Discharge: 2018-08-06 | Disposition: A | Discharge status: Home / Self Care | Payer: 59 | Source: Ambulatory Visit | Attending: Internal Medicine | Admitting: Internal Medicine

## 2018-08-06 DIAGNOSIS — M25511 Pain in right shoulder: Secondary | ICD-10-CM | POA: Diagnosis not present

## 2018-08-06 DIAGNOSIS — E041 Nontoxic single thyroid nodule: Secondary | ICD-10-CM | POA: Diagnosis not present

## 2018-08-06 DIAGNOSIS — E042 Nontoxic multinodular goiter: Secondary | ICD-10-CM

## 2018-08-06 DIAGNOSIS — M25522 Pain in left elbow: Secondary | ICD-10-CM | POA: Diagnosis not present

## 2018-08-12 DIAGNOSIS — Z131 Encounter for screening for diabetes mellitus: Secondary | ICD-10-CM | POA: Diagnosis not present

## 2018-08-12 DIAGNOSIS — E042 Nontoxic multinodular goiter: Secondary | ICD-10-CM | POA: Diagnosis not present

## 2018-08-12 DIAGNOSIS — E785 Hyperlipidemia, unspecified: Secondary | ICD-10-CM | POA: Diagnosis not present

## 2018-08-12 DIAGNOSIS — Z23 Encounter for immunization: Secondary | ICD-10-CM | POA: Diagnosis not present

## 2018-08-13 DIAGNOSIS — E785 Hyperlipidemia, unspecified: Secondary | ICD-10-CM | POA: Diagnosis not present

## 2018-08-13 DIAGNOSIS — Z131 Encounter for screening for diabetes mellitus: Secondary | ICD-10-CM | POA: Diagnosis not present

## 2018-08-13 DIAGNOSIS — E042 Nontoxic multinodular goiter: Secondary | ICD-10-CM | POA: Diagnosis not present

## 2018-08-13 LAB — BASIC METABOLIC PANEL
BUN: 10 (ref 4–21)
Creatinine: 0.9 (ref 0.5–1.1)
Glucose: 88
Potassium: 4.5 (ref 3.4–5.3)
Sodium: 139 (ref 137–147)

## 2018-08-13 LAB — LIPID PANEL
CHOLESTEROL: 215 — AB (ref 0–200)
HDL: 66 (ref 35–70)
LDL CALC: 129
TRIGLYCERIDES: 97 (ref 40–160)

## 2018-08-13 LAB — TSH: TSH: 1.17 (ref 0.41–5.90)

## 2018-08-13 LAB — HEPATIC FUNCTION PANEL
ALT: 15 (ref 7–35)
AST: 20 (ref 13–35)
Alkaline Phosphatase: 62 (ref 25–125)
BILIRUBIN, TOTAL: 0.6

## 2018-08-26 ENCOUNTER — Encounter: Payer: Self-pay | Admitting: Physical Therapy

## 2018-09-19 DIAGNOSIS — Z1231 Encounter for screening mammogram for malignant neoplasm of breast: Secondary | ICD-10-CM | POA: Diagnosis not present

## 2018-10-07 ENCOUNTER — Ambulatory Visit (INDEPENDENT_AMBULATORY_CARE_PROVIDER_SITE_OTHER): Payer: 59 | Admitting: Obstetrics & Gynecology

## 2018-10-07 ENCOUNTER — Encounter: Payer: Self-pay | Admitting: Obstetrics & Gynecology

## 2018-10-07 VITALS — BP 128/80 | Ht 64.0 in | Wt 158.4 lb

## 2018-10-07 DIAGNOSIS — Z780 Asymptomatic menopausal state: Secondary | ICD-10-CM | POA: Diagnosis not present

## 2018-10-07 DIAGNOSIS — N952 Postmenopausal atrophic vaginitis: Secondary | ICD-10-CM

## 2018-10-07 DIAGNOSIS — Z01419 Encounter for gynecological examination (general) (routine) without abnormal findings: Secondary | ICD-10-CM

## 2018-10-07 MED ORDER — ESTROGENS, CONJUGATED 0.625 MG/GM VA CREA: 0.2500 | TOPICAL_CREAM | VAGINAL | 3 refills | Status: DC

## 2018-10-07 MED FILL — PREMARIN VAGINAL CREAM-APPL: 0.625 | 90 days supply | Qty: 30 | Fill #0

## 2018-10-07 NOTE — Patient Instructions (Signed)
1. Well female exam with routine gynecological exam Normal gynecologic exam.  Pap test negative in November 2018.  No indication to repeat this year.  Breast exam normal.  Screening mammogram in November 2019 was negative.  Health labs with family physician.  Body mass index 27.19.  Continue with fitness.  Planning to decrease calories/carbs in diet to reach her ideal body mass index.  2. Postmenopause Stopped hormone replacement therapy 2 weeks ago, well-tolerated.  Recommend vitamin D supplements, calcium intake of 1.5 g a day including nutritional and supplemental calcium, regular weightbearing physical activities.  3. Post-menopausal atrophic vaginitis Dryness and pain with intercourse.  Will treat with Premarin cream.  Usage, risks and benefits reviewed.  Will use a quarter of an applicator vaginally twice a week with a small amount at the vulva twice a week as well.  Other orders - conjugated estrogens (PREMARIN) vaginal cream; Place 6.70 Applicatorfuls vaginally 2 (two) times a week.  Becky, always a pleasure seeing you!

## 2018-10-07 NOTE — Progress Notes (Signed)
Gabrielle Hernandez [birth year] [id number]   History:    57 y.o. [place] Married.  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  Just moved to a new house.  RP:  Established patient presenting for annual gyn exam   HPI: Menopause, well on no HRT x 2 weeks.  No PMB.  No pelvic pain.  C/O pain/dryness with IC, would like an Estrogen cream.  Breasts normal.  Urine/BMs wnl.  BMI 27.19.  Fit.  Will start a lower calorie/carb diet.  Health labs with Fam MD.  Past medical history,surgical history, family history and social history were all reviewed and documented in the EPIC chart.  Gynecologic History Patient's last menstrual period was 10/22/2012. Contraception: post menopausal status and tubal ligation Last Pap: 08/2017. Results were: Negative Last mammogram: 08/2017.  Results were: Negative Bone Density: Never Colonoscopy: 5 yrs ago  Obstetric History OB History  Gravida Para Term Preterm AB Living  2 2       2   SAB TAB Ectopic Multiple Live Births               # Outcome Date GA Lbr Len/2nd Weight Sex Delivery Anes PTL Lv  2 Para           1 Para              ROS: A ROS was performed and pertinent positives and negatives are included in the history.  GENERAL: No fevers or chills. HEENT: No change in vision, no earache, sore throat or sinus congestion. NECK: No pain or stiffness. CARDIOVASCULAR: No chest pain or pressure. No palpitations. PULMONARY: No shortness of breath, cough or wheeze. GASTROINTESTINAL: No abdominal pain, nausea, vomiting or diarrhea, melena or bright red blood per rectum. GENITOURINARY: No urinary frequency, urgency, hesitancy or dysuria. MUSCULOSKELETAL: No joint or muscle pain, no back pain, no recent trauma. DERMATOLOGIC: No rash, no itching, no lesions. ENDOCRINE: No polyuria, polydipsia, no heat or cold intolerance. No recent change in weight. HEMATOLOGICAL: No anemia or easy bruising or bleeding. NEUROLOGIC: No headache, seizures, numbness, tingling or weakness. PSYCHIATRIC: No  depression, no loss of interest in normal activity or change in sleep pattern.     Exam:   BP 128/80   Ht 5\' 4"  (1.626 m)   Wt 158 lb 6.4 oz (71.8 kg)   LMP 10/22/2012   BMI 27.19 kg/m   Body mass index is 27.19 kg/m.  General appearance : Well developed well nourished female. No acute distress HEENT: Eyes: no retinal hemorrhage or exudates,  Neck supple, trachea midline, no carotid bruits, no thyroidmegaly Lungs: Clear to auscultation, no rhonchi or wheezes, or rib retractions  Heart: Regular rate and rhythm, no murmurs or gallops Breast:Examined in sitting and supine position were symmetrical in appearance, no palpable masses or tenderness,  no skin retraction, no nipple inversion, no nipple discharge, no skin discoloration, no axillary or supraclavicular lymphadenopathy Abdomen: no palpable masses or tenderness, no rebound or guarding Extremities: no edema or skin discoloration or tenderness  Pelvic: Vulva: Normal             Vagina: No gross lesions or discharge  Cervix: No gross lesions or discharge  Uterus  AV, normal size, shape and consistency, non-tender and mobile  Adnexa  Without masses or tenderness  Anus: Normal   Assessment/Plan:  57 y.o. female for annual exam   1. Well female exam with routine gynecological exam Normal gynecologic exam.  Pap test negative in November 2018.  No indication to  repeat this year.  Breast exam normal.  Screening mammogram in November 2019 was negative.  Health labs with family physician.  Body mass index 27.19.  Continue with fitness.  Planning to decrease calories/carbs in diet to reach her ideal body mass index.  2. Postmenopause Stopped hormone replacement therapy 2 weeks ago, well-tolerated.  Recommend vitamin D supplements, calcium intake of 1.5 g a day including nutritional and supplemental calcium, regular weightbearing physical activities.  3. Post-menopausal atrophic vaginitis Dryness and pain with intercourse.  Will treat  with Premarin cream.  Usage, risks and benefits reviewed.  Will use a quarter of an applicator vaginally twice a week with a small amount at the vulva twice a week as well.  Other orders - conjugated estrogens (PREMARIN) vaginal cream; Place 9.86 Applicatorfuls vaginally 2 (two) times a week.  Princess Bruins MD, 9:13 AM 10/07/2018

## 2018-12-01 DIAGNOSIS — Z23 Encounter for immunization: Secondary | ICD-10-CM | POA: Diagnosis not present

## 2019-01-19 ENCOUNTER — Telehealth: Payer: Self-pay | Admitting: Family Medicine

## 2019-01-19 NOTE — Telephone Encounter (Signed)
Pt not seen in over a year. Called pt to schedule f/u. Pt stated she will wait until she can come in the office for visit.

## 2019-05-13 ENCOUNTER — Other Ambulatory Visit: Payer: Self-pay

## 2019-05-13 ENCOUNTER — Encounter: Payer: Self-pay | Admitting: Physician Assistant

## 2019-05-13 ENCOUNTER — Ambulatory Visit (INDEPENDENT_AMBULATORY_CARE_PROVIDER_SITE_OTHER): Payer: 59 | Admitting: Physician Assistant

## 2019-05-13 VITALS — BP 154/100 | HR 72 | Temp 98.3°F | Ht 64.0 in | Wt 158.5 lb

## 2019-05-13 DIAGNOSIS — R030 Elevated blood-pressure reading, without diagnosis of hypertension: Secondary | ICD-10-CM | POA: Diagnosis not present

## 2019-05-13 DIAGNOSIS — M25512 Pain in left shoulder: Secondary | ICD-10-CM | POA: Diagnosis not present

## 2019-05-13 DIAGNOSIS — R0789 Other chest pain: Secondary | ICD-10-CM

## 2019-05-13 DIAGNOSIS — M546 Pain in thoracic spine: Secondary | ICD-10-CM | POA: Diagnosis not present

## 2019-05-13 MED ORDER — PANTOPRAZOLE SODIUM 40 MG PO TBEC: 40.0000 mg | DELAYED_RELEASE_TABLET | Freq: Every day | ORAL | 0 refills | Status: DC

## 2019-05-13 MED ORDER — MELOXICAM 15 MG PO TABS: 15.0000 mg | ORAL_TABLET | Freq: Every day | ORAL | 0 refills | Status: DC

## 2019-05-13 MED FILL — PANTOPRAZOLE SOD DR 40 MG T: 40 | 30 days supply | Qty: 30 | Fill #0

## 2019-05-13 MED FILL — MELOXICAM 15 MG TABLET: 15 | 30 days supply | Qty: 30 | Fill #0

## 2019-05-13 NOTE — Progress Notes (Signed)
Gabrielle Hernandez is a 58 y.o. female here for a new problem.  I acted as a Education administrator for [employer], PA-C Anselmo Pickler, LPN  History of Present Illness:   Chief Complaint  Patient presents with  . Back Pain    HPI  Back pain Pt c/o mid upper back pain and left sided chest discomfort radiating down left arm. Started after she was doing upper body strength training with overhead extensions, tricep extensions, among others. Husband is a Film/video editor and she discussed all of her symptoms with him and he did not feel it was cardiac. Pt alternated Tylenol and Ibuprofen rest of the day, used heat but stopped made worse so went to ice and had some relief. She has continued to take ibuprofen and tylenol since it has happened. Pt is having upper chest discomfort and pain down left arm. Took 2 extra strength Tylenol this morning with some relief.  BP Readings from Last 3 Encounters:  05/13/19 (!) 154/100  10/07/18 128/80  04/23/18 130/90   Pain was so severe at one point when she tried to pinpoint it that it made her nauseous. Denies any other GI issues including: v/d/c, pain with meals.  She is having some gastritis from the ibuprofen, and has always had this issue.  Denies: SOB, cough, worsening chest pain with exertion, jaw pain  Has long standing hx of bilateral shoulder pain that she works with PT.   Past Medical History:  Diagnosis Date  . Gallstones 1993  . GERD (gastroesophageal reflux disease)   . Multinodular goiter 2009   Right thyroid nodule. Negative FNA. Followed by Endocrine.  . Seasonal allergies   . Superficial basal cell carcinoma on right shoulder 2011     Social History   Socioeconomic History  . Marital status: Married    Spouse name: Not on file  . Number of children: 3  . Years of education: Not on file  . Highest education level: Not on file  Occupational History  . Occupation: Web designer  Social Needs  . Financial resource strain: Not  on file  . Food insecurity    Worry: Not on file    Inability: Not on file  . Transportation needs    Medical: Not on file    Non-medical: Not on file  Tobacco Use  . Smoking status: Former Smoker    Start date: 11/13/1989  . Smokeless tobacco: Never Used  Substance and Sexual Activity  . Alcohol use: Yes    Alcohol/week: 0.0 standard drinks    Comment: 1 glass of wine a night  . Drug use: No  . Sexual activity: Yes    Partners: Male    Comment: 1st intercourse- 19, partners- 27, married- 26 yrs   Lifestyle  . Physical activity    Days per week: Not on file    Minutes per session: Not on file  . Stress: Not on file  Relationships  . Social Herbalist on phone: Not on file    Gets together: Not on file    Attends religious service: Not on file    Active member of club or organization: Not on file    Attends meetings of clubs or organizations: Not on file    Relationship status: Not on file  . Intimate partner violence    Fear of current or ex partner: Not on file    Emotionally abused: Not on file    Physically abused: Not on file  Forced sexual activity: Not on file  Other Topics Concern  . Not on file  Social History Narrative   Married to Dr. Kirk Ruths of cardiology   2 biologic adult sons one adopted daughter   Works part-time as an Web designer    Past Surgical History:  Procedure Laterality Date  . CHOLECYSTECTOMY  1993  . DILATION AND CURETTAGE OF UTERUS    . ESOPHAGOGASTRODUODENOSCOPY    . THYROID SURGERY  7/11   thyroid biopsy  . TUBAL LIGATION  1996    Family History  Problem Relation Age of Onset  . Hypertension Mother   . Hypertension Brother   . Heart disease Maternal Grandfather   . Parkinson's disease Maternal Grandfather   . Coronary artery disease Maternal Grandfather   . Stomach cancer Paternal Grandmother   . Colon cancer Neg Hx   . Kidney disease Neg Hx   . Gallbladder disease Neg Hx   . Esophageal cancer  Neg Hx     Allergies  Allergen Reactions  . Latex Itching    Pt gets itchy with contact with latex    Current Medications:   Current Outpatient Medications:  .  conjugated estrogens (PREMARIN) vaginal cream, Place 1 Applicatorful vaginally 2 (two) times a week., Disp: , Rfl:  .  famotidine (PEPCID) 20 MG tablet, Take 20 mg by mouth 2 (two) times daily as needed for heartburn or indigestion., Disp: , Rfl:  .  triamcinolone (NASACORT ALLERGY 24HR) 55 MCG/ACT AERO nasal inhaler, Place 2 sprays into the nose daily., Disp: , Rfl:  .  meloxicam (MOBIC) 15 MG tablet, Take 1 tablet (15 mg total) by mouth daily., Disp: 30 tablet, Rfl: 0 .  pantoprazole (PROTONIX) 40 MG tablet, Take 1 tablet (40 mg total) by mouth daily., Disp: 30 tablet, Rfl: 0   Review of Systems:   ROS  Negative unless otherwise specified per HPI.  Vitals:   Vitals:   05/13/19 1051 05/13/19 1116  BP: (!) 150/90 (!) 154/100  Pulse: 85 72  Temp: 98.3 F (36.8 C)   TempSrc: Temporal   SpO2: 97%   Weight: 158 lb 8 oz (71.9 kg)   Height: 5\' 4"  (1.626 m)      Body mass index is 27.21 kg/m.  Physical Exam:   Physical Exam Vitals signs and nursing note reviewed.  Constitutional:      General: She is not in acute distress.    Appearance: She is well-developed. She is not ill-appearing or toxic-appearing.  HENT:     Head: Normocephalic and atraumatic.  Eyes:     Conjunctiva/sclera: Conjunctivae normal.  Neck:     Musculoskeletal: Normal range of motion and neck supple.  Cardiovascular:     Rate and Rhythm: Normal rate and regular rhythm.     Pulses: Normal pulses.     Heart sounds: Normal heart sounds, S1 normal and S2 normal.     Comments: No LE edema Pulmonary:     Effort: Pulmonary effort is normal.     Breath sounds: Normal breath sounds.  Musculoskeletal: Normal range of motion.     Comments: Shoulder exam: No deformity of shoulders on inspection. No pain with palpation of shoulder landmarks. ROM  in abduction and forward flexion - causes slight discomfort when pushed above 90 degrees.  Pain elicited with resisted abduction and forward flexion.  Back exam No decreased ROM 2/2 pain with flexion/extension, lateral side bends, or rotation. Reproducible tenderness with deep palpation to left paraspinal thoracic muscles. No  bony tenderness. No evidence of erythema, rash or ecchymosis. Negative STLR bilaterally.    Skin:    General: Skin is warm and dry.  Neurological:     Mental Status: She is alert and oriented to person, place, and time.     GCS: GCS eye subscore is 4. GCS verbal subscore is 5. GCS motor subscore is 6.     Comments: Grip strength 5/5  Reduced strength 4/5 with resisted flexion of L arm  Psychiatric:        Speech: Speech normal.        Behavior: Behavior normal. Behavior is cooperative.        Thought Content: Thought content normal.        Judgment: Judgment normal.       Assessment and Plan:   Olean was seen today for back pain.  Diagnoses and all orders for this visit:  Acute left-sided thoracic back pain; Chest wall discomfort; L shoulder pain No red flags on exam. Will trial mobic and protonix for GI protection. Suspect muscular strain. She would like to be evaluated by her PT and if warranted, see an orthopedist who is a personal friend of hers. She will let us know if she needs a referral.  Elevated BP without diagnosis of hypertension Uncontrolled. She states that this is not uncommon for her. She is asymptomatic. Recommended checking BP intermittently and follow-up in two weeks, sooner if needed. Red flags advised.  Other orders -     pantoprazole (PROTONIX) 40 MG tablet; Take 1 tablet (40 mg total) by mouth daily. -     meloxicam (MOBIC) 15 MG tablet; Take 1 tablet (15 mg total) by mouth daily.    . Reviewed expectations re: course of current medical issues. . Discussed self-management of symptoms. . Outlined signs and symptoms indicating  need for more acute intervention. . Patient verbalized understanding and all questions were answered. . See orders for this visit as documented in the electronic medical record. . Patient received an After-Visit Summary.  CMA or LPN served as scribe during this visit. History, Physical, and Plan performed by medical provider. The above documentation has been reviewed and is accurate and complete.   Inda Coke, PA-C

## 2019-05-13 NOTE — Patient Instructions (Signed)
It was great to see you!  -- Please keep an eye on your blood pressure. If it doesn't improve, we definitely need to know about it. If you develop severe chest pain, shortness of breath, or any other concerning symptoms, please tell your husband and go to the ER.  -- Start daily meloxicam and protonix.  -- If PT is unhelpful, I would like for you to consider letting us to send you to Dr. Eunice Blase at [facility]. He usually has same week availability. I would be happy to put this referral in if/when you'd like.  Take care,  Inda Coke PA-C

## 2019-05-27 ENCOUNTER — Encounter: Payer: Self-pay | Admitting: Physician Assistant

## 2019-05-27 ENCOUNTER — Ambulatory Visit (INDEPENDENT_AMBULATORY_CARE_PROVIDER_SITE_OTHER): Payer: 59 | Admitting: Physician Assistant

## 2019-05-27 ENCOUNTER — Other Ambulatory Visit: Payer: Self-pay

## 2019-05-27 VITALS — BP 120/88 | HR 79 | Temp 97.6°F | Ht 64.0 in | Wt 158.5 lb

## 2019-05-27 DIAGNOSIS — R030 Elevated blood-pressure reading, without diagnosis of hypertension: Secondary | ICD-10-CM | POA: Diagnosis not present

## 2019-05-27 NOTE — Patient Instructions (Signed)
It was great to see you!  Please check your blood pressure regularly for me and send me a MyChart message with the average after about 2 weeks.  Please schedule a physical with me or Dr. Juleen China whenever you're available. We should be able to do this whenever as it is coded differently from your ob-gyn visit.  Take care,  Inda Coke PA-C

## 2019-05-27 NOTE — Progress Notes (Signed)
Gabrielle Hernandez is a 58 y.o. female is here to follow up on blood pressure.  I acted as a Education administrator for [employer], PA-C [employer], LPN  History of Present Illness:   Chief Complaint  Patient presents with  . f/u on blood pressure    HPI Elevated blood pressure Pt following up today on blood pressure. She is not checking blood pressure regularly, last night reading was 138/98. Pt denies headaches, dizziness, blurred vision, chest pain, SOB or lower leg edema. Denies excessive caffeine intake, stimulant usage, excessive alcohol intake or increase in salt consumption.  BP Readings from Last 3 Encounters:  05/27/19 120/88  05/13/19 (!) 154/100  10/07/18 128/80     Health Maintenance Due  Topic Date Due  . INFLUENZA VACCINE  05/09/2019    Past Medical History:  Diagnosis Date  . Gallstones 1993  . GERD (gastroesophageal reflux disease)   . Multinodular goiter 2009   Right thyroid nodule. Negative FNA. Followed by Endocrine.  . Seasonal allergies   . Superficial basal cell carcinoma on right shoulder 2011     Social History   Socioeconomic History  . Marital status: Married    Spouse name: Not on file  . Number of children: 3  . Years of education: Not on file  . Highest education level: Not on file  Occupational History  . Occupation: Web designer  Social Needs  . Financial resource strain: Not on file  . Food insecurity    Worry: Not on file    Inability: Not on file  . Transportation needs    Medical: Not on file    Non-medical: Not on file  Tobacco Use  . Smoking status: Former Smoker    Start date: 11/13/1989  . Smokeless tobacco: Never Used  Substance and Sexual Activity  . Alcohol use: Yes    Alcohol/week: 0.0 standard drinks    Comment: 1 glass of wine a night  . Drug use: No  . Sexual activity: Yes    Partners: Male    Comment: 1st intercourse- 42, partners- 70, married- 14 yrs   Lifestyle  . Physical activity    Days  per week: Not on file    Minutes per session: Not on file  . Stress: Not on file  Relationships  . Social Herbalist on phone: Not on file    Gets together: Not on file    Attends religious service: Not on file    Active member of club or organization: Not on file    Attends meetings of clubs or organizations: Not on file    Relationship status: Not on file  . Intimate partner violence    Fear of current or ex partner: Not on file    Emotionally abused: Not on file    Physically abused: Not on file    Forced sexual activity: Not on file  Other Topics Concern  . Not on file  Social History Narrative   Married to Dr. Kirk Ruths of cardiology   2 biologic adult sons one adopted daughter   Works part-time as an Web designer    Past Surgical History:  Procedure Laterality Date  . CHOLECYSTECTOMY  1993  . DILATION AND CURETTAGE OF UTERUS    . ESOPHAGOGASTRODUODENOSCOPY    . THYROID SURGERY  7/11   thyroid biopsy  . TUBAL LIGATION  1996    Family History  Problem Relation Age of Onset  . Hypertension Mother   .  Hypertension Brother   . Heart disease Maternal Grandfather   . Parkinson's disease Maternal Grandfather   . Coronary artery disease Maternal Grandfather   . Stomach cancer Paternal Grandmother   . Colon cancer Neg Hx   . Kidney disease Neg Hx   . Gallbladder disease Neg Hx   . Esophageal cancer Neg Hx     PMHx, SurgHx, SocialHx, FamHx, Medications, and Allergies were reviewed in the Visit Navigator and updated as appropriate.   Patient Active Problem List   Diagnosis Date Noted  . Elevated BP without diagnosis of hypertension 01/16/2017  . Thyroid nodule 03/02/2008  . Allergic rhinitis 02/18/2008    Social History   Tobacco Use  . Smoking status: Former Smoker    Start date: 11/13/1989  . Smokeless tobacco: Never Used  Substance Use Topics  . Alcohol use: Yes    Alcohol/week: 0.0 standard drinks    Comment: 1 glass of wine a  night  . Drug use: No    Current Medications and Allergies:    Current Outpatient Medications:  .  conjugated estrogens (PREMARIN) vaginal cream, Place 1 Applicatorful vaginally 2 (two) times a week., Disp: , Rfl:  .  pantoprazole (PROTONIX) 40 MG tablet, Take 1 tablet (40 mg total) by mouth daily., Disp: 30 tablet, Rfl: 0 .  triamcinolone (NASACORT ALLERGY 24HR) 55 MCG/ACT AERO nasal inhaler, Place 2 sprays into the nose daily., Disp: , Rfl:  .  meloxicam (MOBIC) 15 MG tablet, Take 1 tablet (15 mg total) by mouth daily. (Patient not taking: Reported on 05/27/2019), Disp: 30 tablet, Rfl: 0   Allergies  Allergen Reactions  . Latex Itching    Pt gets itchy with contact with latex    Review of Systems   ROS Negative unless otherwise specified per HPI.  Vitals:   Vitals:   05/27/19 0805  BP: 120/88  Pulse: 79  Temp: 97.6 F (36.4 C)  TempSrc: Temporal  SpO2: 99%  Weight: 158 lb 8 oz (71.9 kg)  Height: 5\' 4"  (1.626 m)     Body mass index is 27.21 kg/m.   Physical Exam:    Physical Exam Vitals signs and nursing note reviewed.  Constitutional:      General: She is not in acute distress.    Appearance: She is well-developed. She is not ill-appearing or toxic-appearing.  Cardiovascular:     Rate and Rhythm: Normal rate and regular rhythm.     Pulses: Normal pulses.     Heart sounds: Normal heart sounds, S1 normal and S2 normal.     Comments: No LE edema Pulmonary:     Effort: Pulmonary effort is normal.     Breath sounds: Normal breath sounds.  Skin:    General: Skin is warm and dry.  Neurological:     Mental Status: She is alert.     GCS: GCS eye subscore is 4. GCS verbal subscore is 5. GCS motor subscore is 6.  Psychiatric:        Speech: Speech normal.        Behavior: Behavior normal. Behavior is cooperative.      Assessment and Plan:    Gabrielle Hernandez was seen today for f/u on blood pressure.  Diagnoses and all orders for this visit:  Elevated BP without  diagnosis of hypertension   Normotensive today in my office.  I did recommend that she check her blood pressure regularly at home.  I recommended close follow-up if symptoms develop.  Also recommended she send me  a MyChart message with her average blood pressures over the next 2 weeks or so.  She verbalized understanding to plan.  . Reviewed expectations re: course of current medical issues. . Discussed self-management of symptoms. . Outlined signs and symptoms indicating need for more acute intervention. . Patient verbalized understanding and all questions were answered. . See orders for this visit as documented in the electronic medical record. . Patient received an After Visit Summary.  CMA or LPN served as scribe during this visit. History, Physical, and Plan performed by medical provider. The above documentation has been reviewed and is accurate and complete.   Inda Coke, PA-C [place], [place] 05/27/2019  Follow-up: No follow-ups on file.

## 2019-06-09 ENCOUNTER — Encounter: Payer: 59 | Admitting: Physician Assistant

## 2019-06-10 ENCOUNTER — Encounter: Payer: 59 | Admitting: Physician Assistant

## 2019-06-16 ENCOUNTER — Other Ambulatory Visit: Payer: Self-pay

## 2019-06-16 ENCOUNTER — Encounter: Payer: Self-pay | Admitting: Physician Assistant

## 2019-06-16 ENCOUNTER — Ambulatory Visit (INDEPENDENT_AMBULATORY_CARE_PROVIDER_SITE_OTHER): Payer: 59 | Admitting: Physician Assistant

## 2019-06-16 ENCOUNTER — Other Ambulatory Visit: Payer: Self-pay | Admitting: Physician Assistant

## 2019-06-16 VITALS — BP 140/90 | HR 74 | Temp 97.2°F | Ht 64.5 in | Wt 159.0 lb

## 2019-06-16 DIAGNOSIS — Z833 Family history of diabetes mellitus: Secondary | ICD-10-CM

## 2019-06-16 DIAGNOSIS — R030 Elevated blood-pressure reading, without diagnosis of hypertension: Secondary | ICD-10-CM

## 2019-06-16 DIAGNOSIS — Z13220 Encounter for screening for lipoid disorders: Secondary | ICD-10-CM | POA: Diagnosis not present

## 2019-06-16 DIAGNOSIS — E559 Vitamin D deficiency, unspecified: Secondary | ICD-10-CM | POA: Diagnosis not present

## 2019-06-16 DIAGNOSIS — Z0000 Encounter for general adult medical examination without abnormal findings: Secondary | ICD-10-CM

## 2019-06-16 DIAGNOSIS — Z23 Encounter for immunization: Secondary | ICD-10-CM | POA: Diagnosis not present

## 2019-06-16 DIAGNOSIS — E041 Nontoxic single thyroid nodule: Secondary | ICD-10-CM

## 2019-06-16 DIAGNOSIS — Z136 Encounter for screening for cardiovascular disorders: Secondary | ICD-10-CM | POA: Diagnosis not present

## 2019-06-16 LAB — COMPREHENSIVE METABOLIC PANEL
ALT: 17 U/L (ref 0–35)
AST: 19 U/L (ref 0–37)
Albumin: 4.6 g/dL (ref 3.5–5.2)
Alkaline Phosphatase: 81 U/L (ref 39–117)
BUN: 14 mg/dL (ref 6–23)
CO2: 30 mEq/L (ref 19–32)
Calcium: 9.7 mg/dL (ref 8.4–10.5)
Chloride: 102 mEq/L (ref 96–112)
Creatinine, Ser: 0.89 mg/dL (ref 0.40–1.20)
GFR: 65.53 mL/min (ref >60.00)
Glucose, Bld: 96 mg/dL (ref 70–99)
Potassium: 4.9 mEq/L (ref 3.5–5.1)
Sodium: 140 mEq/L (ref 135–145)
Total Bilirubin: 0.5 mg/dL (ref 0.2–1.2)
Total Protein: 7.1 g/dL (ref 6.0–8.3)

## 2019-06-16 LAB — LIPID PANEL
Cholesterol: 212 mg/dL — ABNORMAL HIGH (ref 0–200)
HDL: 60.4 mg/dL (ref >39.00)
LDL Cholesterol: 128 mg/dL — ABNORMAL HIGH (ref 0–99)
NonHDL: 151.51
Total CHOL/HDL Ratio: 4
Triglycerides: 119 mg/dL (ref 0.0–149.0)
VLDL: 23.8 mg/dL (ref 0.0–40.0)

## 2019-06-16 LAB — CBC WITH DIFFERENTIAL/PLATELET
Basophils Absolute: 0.1 10*3/uL (ref 0.0–0.1)
Basophils Relative: 1.2 % (ref 0.0–3.0)
Eosinophils Absolute: 0.1 10*3/uL (ref 0.0–0.7)
Eosinophils Relative: 2.5 % (ref 0.0–5.0)
HCT: 41.7 % (ref 36.0–46.0)
Hemoglobin: 14 g/dL (ref 12.0–15.0)
Lymphocytes Relative: 23.4 % (ref 12.0–46.0)
Lymphs Abs: 1.3 10*3/uL (ref 0.7–4.0)
MCHC: 33.4 g/dL (ref 30.0–36.0)
MCV: 85 fl (ref 78.0–100.0)
Monocytes Absolute: 0.5 10*3/uL (ref 0.1–1.0)
Monocytes Relative: 8.1 % (ref 3.0–12.0)
Neutro Abs: 3.7 10*3/uL (ref 1.4–7.7)
Neutrophils Relative %: 64.8 % (ref 43.0–77.0)
Platelets: 209 10*3/uL (ref 150.0–400.0)
RBC: 4.91 Mil/uL (ref 3.87–5.11)
RDW: 13.9 % (ref 11.5–15.5)
WBC: 5.6 10*3/uL (ref 4.0–10.5)

## 2019-06-16 LAB — TSH: TSH: 1.79 u[IU]/mL (ref 0.35–4.50)

## 2019-06-16 LAB — VITAMIN D 25 HYDROXY (VIT D DEFICIENCY, FRACTURES): VITD: 19.25 ng/mL — ABNORMAL LOW (ref 30.00–100.00)

## 2019-06-16 LAB — HEMOGLOBIN A1C: Hgb A1c MFr Bld: 5.8 % (ref 4.6–6.5)

## 2019-06-16 MED ORDER — VITAMIN D (ERGOCALCIFEROL) 1.25 MG (50000 UNIT) PO CAPS: 50000.0000 [IU] | ORAL_CAPSULE | ORAL | 0 refills | Status: AC

## 2019-06-16 MED FILL — VIT D2 1.25 MG (50,000 UNIT: 1.25 MG | 56 days supply | Qty: 8 | Fill #0

## 2019-06-16 NOTE — Patient Instructions (Addendum)
It was great to see you!  Please go to the lab for blood work.   Our office will call you with your results unless you have chosen to receive results via MyChart.  If your blood work is normal we will follow-up each year for physicals and as scheduled for chronic medical problems.  If anything is abnormal we will treat accordingly and get you in for a follow-up.  Take care,  Aldona Bar  *Keep checking your blood pressure a few times a month. Follow-up if you are getting numbers consistently >140/90 and/or you develop any symptoms.   Health Maintenance, Female Adopting a healthy lifestyle and getting preventive care are important in promoting health and wellness. Ask your health care provider about:  The right schedule for you to have regular tests and exams.  Things you can do on your own to prevent diseases and keep yourself healthy. What should I know about diet, weight, and exercise? Eat a healthy diet   Eat a diet that includes plenty of vegetables, fruits, low-fat dairy products, and lean protein.  Do not eat a lot of foods that are high in solid fats, added sugars, or sodium. Maintain a healthy weight Body mass index (BMI) is used to identify weight problems. It estimates body fat based on height and weight. Your health care provider can help determine your BMI and help you achieve or maintain a healthy weight. Get regular exercise Get regular exercise. This is one of the most important things you can do for your health. Most adults should:  Exercise for at least 150 minutes each week. The exercise should increase your heart rate and make you sweat (moderate-intensity exercise).  Do strengthening exercises at least twice a week. This is in addition to the moderate-intensity exercise.  Spend less time sitting. Even light physical activity can be beneficial. Watch cholesterol and blood lipids Have your blood tested for lipids and cholesterol at 56 years of age, then have  this test every 5 years. Have your cholesterol levels checked more often if:  Your lipid or cholesterol levels are high.  You are older than 56 years of age.  You are at high risk for heart disease. What should I know about cancer screening? Depending on your health history and family history, you may need to have cancer screening at various ages. This may include screening for:  Breast cancer.  Cervical cancer.  Colorectal cancer.  Skin cancer.  Lung cancer. What should I know about heart disease, diabetes, and high blood pressure? Blood pressure and heart disease  High blood pressure causes heart disease and increases the risk of stroke. This is more likely to develop in people who have high blood pressure readings, are of African descent, or are overweight.  Have your blood pressure checked: ? Every 3-5 years if you are 62-48 years of age. ? Every year if you are 12 years old or older. Diabetes Have regular diabetes screenings. This checks your fasting blood sugar level. Have the screening done:  Once every three years after age 4 if you are at a normal weight and have a low risk for diabetes.  More often and at a younger age if you are overweight or have a high risk for diabetes. What should I know about preventing infection? Hepatitis B If you have a higher risk for hepatitis B, you should be screened for this virus. Talk with your health care provider to find out if you are at risk for hepatitis B infection.  Hepatitis C Testing is recommended for:  Everyone born from [birth year] through 1963.  Anyone with known risk factors for hepatitis C. Sexually transmitted infections (STIs)  Get screened for STIs, including gonorrhea and chlamydia, if: ? You are sexually active and are younger than 56 years of age. ? You are older than 56 years of age and your health care provider tells you that you are at risk for this type of infection. ? Your sexual activity has changed since  you were last screened, and you are at increased risk for chlamydia or gonorrhea. Ask your health care provider if you are at risk.  Ask your health care provider about whether you are at high risk for HIV. Your health care provider may recommend a prescription medicine to help prevent HIV infection. If you choose to take medicine to prevent HIV, you should first get tested for HIV. You should then be tested every 3 months for as long as you are taking the medicine. Pregnancy  If you are about to stop having your period (premenopausal) and you may become pregnant, seek counseling before you get pregnant.  Take 400 to 800 micrograms (mcg) of folic acid every day if you become pregnant.  Ask for birth control (contraception) if you want to prevent pregnancy. Osteoporosis and menopause Osteoporosis is a disease in which the bones lose minerals and strength with aging. This can result in bone fractures. If you are 29 years old or older, or if you are at risk for osteoporosis and fractures, ask your health care provider if you should:  Be screened for bone loss.  Take a calcium or vitamin D supplement to lower your risk of fractures.  Be given hormone replacement therapy (HRT) to treat symptoms of menopause. Follow these instructions at home: Lifestyle  Do not use any products that contain nicotine or tobacco, such as cigarettes, e-cigarettes, and chewing tobacco. If you need help quitting, ask your health care provider.  Do not use street drugs.  Do not share needles.  Ask your health care provider for help if you need support or information about quitting drugs. Alcohol use  Do not drink alcohol if: ? Your health care provider tells you not to drink. ? You are pregnant, may be pregnant, or are planning to become pregnant.  If you drink alcohol: ? Limit how much you use to 0-1 drink a day. ? Limit intake if you are breastfeeding.  Be aware of how much alcohol is in your drink. In  the [place] one drink equals one 12 oz bottle of beer (355 mL), one 5 oz glass of wine (148 mL), or one 1 oz glass of hard liquor (44 mL). General instructions  Schedule regular health, dental, and eye exams.  Stay current with your vaccines.  Tell your health care provider if: ? You often feel depressed. ? You have ever been abused or do not feel safe at home. Summary  Adopting a healthy lifestyle and getting preventive care are important in promoting health and wellness.  Follow your health care provider's instructions about healthy diet, exercising, and getting tested or screened for diseases.  Follow your health care provider's instructions on monitoring your cholesterol and blood pressure. This information is not intended to replace advice given to you by your health care provider. Make sure you discuss any questions you have with your health care provider. Document Released: 04/09/2011 Document Revised: 09/17/2018 Document Reviewed: 09/17/2018 Elsevier Patient Education  2020 [employer].

## 2019-06-16 NOTE — Progress Notes (Signed)
I acted as a Education administrator for [employer], PA-C Anselmo Pickler, LPN   Subjective:    Gabrielle Hernandez is a 58 y.o. female and is here for a comprehensive physical exam.   HPI  There are no preventive care reminders to display for this patient.  Acute Concerns: None  Chronic Issues: Vit D deficiency -- last vit D checked about 2 years ago -- was 48. She does not take regular vit D supplementation. She also tries to avoid dairy. She does do weight bearing activity as able. Thyroid nodule -- sees Dr. Buddy Duty. Last thyroid u/s was 08/06/18. Shows two nodules -- one that was already sampled and another that doesn't meet criteria for bx. She sees Dr. Buddy Duty q 1.5-2 years. Last TSH was 10 months ago and was 1.17. Family hx of DM -- mother recently diagnosed with DM2. Elevated BP without dx of HTN -- Currently not on any medications. At home blood pressure readings are: 126-138/84-91. Patient denies chest pain, SOB, blurred vision, dizziness, unusual headaches, lower leg swelling.  Denies excessive caffeine intake, stimulant usage, excessive alcohol intake, or increase in salt consumption.  Health Maintenance: Immunizations -- UTD, Flu vaccine given today Colonoscopy -- UTD, due 12/2024 Mammogram --UTD, due 09/2019 PAP -- UTD, done 08/2017 Normal, due 08/2020 Bone Density -- N/A Diet -- well balanced, eats all food groups except dairy Sleep habits -- poor sleep due to menopause per patient, does use sleepy time tea that can help Exercise -- strength training Weight -- Weight: 159 lb (72.1 kg)  Mood -- reports irritability at times Weight history: Wt Readings from Last 10 Encounters:  06/16/19 159 lb (72.1 kg)  05/27/19 158 lb 8 oz (71.9 kg)  05/13/19 158 lb 8 oz (71.9 kg)  10/07/18 158 lb 6.4 oz (71.8 kg)  04/23/18 145 lb (65.8 kg)  08/16/17 149 lb (67.6 kg)  03/05/17 150 lb (68 kg)  01/16/17 149 lb 9.6 oz (67.9 kg)  12/29/15 149 lb 3.2 oz (67.7 kg)  08/22/15 147 lb 12.8 oz (67 kg)    Patient's last menstrual period was 10/22/2012.   Depression screen PHQ 2/9 06/16/2019  Decreased Interest 0  Down, Depressed, Hopeless 0  PHQ - 2 Score 0     Other providers/specialists: Patient Care Team: Inda Coke, [place] as PCP - General (Physician Assistant) Delrae Rend, MD as Consulting Physician (Endocrinology)    PMHx, SurgHx, SocialHx, Medications, and Allergies were reviewed in the Visit Navigator and updated as appropriate.   Past Medical History:  Diagnosis Date  . Gallstones 1993  . GERD (gastroesophageal reflux disease)   . Multinodular goiter 2009   Right thyroid nodule. Negative FNA. Followed by Endocrine.  . Seasonal allergies   . Superficial basal cell carcinoma on right shoulder 2011     Past Surgical History:  Procedure Laterality Date  . CHOLECYSTECTOMY  1993  . DILATION AND CURETTAGE OF UTERUS    . ESOPHAGOGASTRODUODENOSCOPY    . THYROID SURGERY  7/11   thyroid biopsy  . TUBAL LIGATION  1996     Family History  Problem Relation Age of Onset  . Hypertension Mother   . Hypertension Brother   . Heart disease Maternal Grandfather   . Parkinson's disease Maternal Grandfather   . Coronary artery disease Maternal Grandfather   . Stomach cancer Paternal Grandmother   . Colon cancer Neg Hx   . Kidney disease Neg Hx   . Gallbladder disease Neg Hx   . Esophageal cancer Neg Hx  Social History   Tobacco Use  . Smoking status: Former Smoker    Start date: 11/13/1989  . Smokeless tobacco: Never Used  Substance Use Topics  . Alcohol use: Yes    Alcohol/week: 0.0 standard drinks    Comment: 1 glass of wine a night  . Drug use: No    Review of Systems:   Review of Systems  Constitutional: Negative.  Negative for chills, fever, malaise/fatigue and weight loss.  HENT: Negative.  Negative for hearing loss, sinus pain and sore throat.   Eyes: Negative.  Negative for blurred vision.  Respiratory: Negative.  Negative for cough and  shortness of breath.   Cardiovascular: Negative.  Negative for chest pain, palpitations and leg swelling.  Gastrointestinal: Negative.  Negative for abdominal pain, constipation, diarrhea, heartburn, nausea and vomiting.  Genitourinary: Negative.  Negative for dysuria, frequency and urgency.  Musculoskeletal: Negative.  Negative for back pain, myalgias and neck pain.  Skin: Negative.  Negative for itching and rash.  Neurological: Negative.  Negative for dizziness, tingling, seizures, loss of consciousness and headaches.  Endo/Heme/Allergies: Negative.  Negative for polydipsia.  Psychiatric/Behavioral: Negative.  Negative for depression. The patient is not nervous/anxious.     Objective:   BP 140/90 (BP Location: Left Arm, Patient Position: Sitting, Cuff Size: Normal)   Pulse 74   Temp (!) 97.2 F (36.2 C) (Temporal)   Ht 5' 4.5" (1.638 m)   Wt 159 lb (72.1 kg)   LMP 10/22/2012   SpO2 98%   BMI 26.87 kg/m  Body mass index is 26.87 kg/m.   General Appearance:    Alert, cooperative, no distress, appears stated age  Head:    Normocephalic, without obvious abnormality, atraumatic  Eyes:    PERRL, conjunctiva/corneas clear, EOM's intact, fundi    benign, both eyes  Ears:    Normal TM's and external ear canals, both ears  Nose:   Nares normal, septum midline, mucosa normal, no drainage    or sinus tenderness  Throat:   Lips, mucosa, and tongue normal; teeth and gums normal  Neck:   Supple, symmetrical, trachea midline, no adenopathy;    thyroid:  no enlargement/tenderness/nodules; no carotid   bruit or JVD  Back:     Symmetric, no curvature, ROM normal, no CVA tenderness  Lungs:     Clear to auscultation bilaterally, respirations unlabored  Chest Wall:    No tenderness or deformity   Heart:    Regular rate and rhythm, S1 and S2 normal, no murmur, rub or gallop  Breast Exam:    Deferred  Abdomen:     Soft, non-tender, bowel sounds active all four quadrants,    no masses, no  organomegaly  Genitalia:    Deferred  Extremities:   Extremities normal, atraumatic, no cyanosis or edema  Pulses:   2+ and symmetric all extremities  Skin:   Skin color, texture, turgor normal, no rashes or lesions  Lymph nodes:   Cervical, supraclavicular, and axillary nodes normal  Neurologic:   CNII-XII intact, normal strength, sensation and reflexes    throughout    Assessment/Plan:   Trea was seen today for annual exam.  Diagnoses and all orders for this visit:  Routine physical examination Today patient counseled on age appropriate routine health concerns for screening and prevention, each reviewed and up to date or declined. Immunizations reviewed and up to date or declined. Labs ordered and reviewed. Risk factors for depression reviewed and negative. Hearing function and visual acuity are intact. ADLs  screened and addressed as needed. Functional ability and level of safety reviewed and appropriate. Education, counseling and referrals performed based on assessed risks today. Patient provided with a copy of personalized plan for preventive services. -     CBC with Differential/Platelet -     Comprehensive metabolic panel  Elevated BP without diagnosis of hypertension Home BP is normotensive. No signs or symptoms to suggest uncontrolled BP. Will update labs. I did recommend she check her blood pressure a few times a month or if she is feeling symptomatic. Follow-up in 1 year, sooner if concerns. -     CBC with Differential/Platelet -     Comprehensive metabolic panel -     TSH  Thyroid nodule Managed by Dr. Buddy Duty. Is due for her yearly TSH check, we will check this today.  Vitamin D deficiency Will update labs and make recommendations as appropriate. -     VITAMIN D 25 Hydroxy (Vit-D Deficiency, Fractures)  Need for immunization against influenza -     Flu Vaccine QUAD 36+ mos IM  Encounter for lipid screening for cardiovascular disease Update ASCVD and provide  recommendations based on results. -     Lipid panel  Family history of diabetes mellitus Check HgbA1c today. -     Hemoglobin A1c    Well Adult Exam: Labs ordered: Yes. Patient counseling was done. See below for items discussed. Follow up in one year. Breast cancer screening: UTD. Cervical cancer screening: UTD   Patient Counseling: [x]    Nutrition: Stressed importance of moderation in sodium/caffeine intake, saturated fat and cholesterol, caloric balance, sufficient intake of fresh fruits, vegetables, fiber, calcium, iron, and 1 mg of folate supplement per day (for females capable of pregnancy).  [x]    Stressed the importance of regular exercise.   [x]    Substance Abuse: Discussed cessation/primary prevention of tobacco, alcohol, or other drug use; driving or other dangerous activities under the influence; availability of treatment for abuse.   [x]    Injury prevention: Discussed safety belts, safety helmets, smoke detector, smoking near bedding or upholstery.   [x]    Sexuality: Discussed sexually transmitted diseases, partner selection, use of condoms, avoidance of unintended pregnancy  and contraceptive alternatives.  [x]    Dental health: Discussed importance of regular tooth brushing, flossing, and dental visits.  [x]    Health maintenance and immunizations reviewed. Please refer to Health maintenance section.   CMA or LPN served as scribe during this visit. History, Physical, and Plan performed by medical provider. The above documentation has been reviewed and is accurate and complete.   Inda Coke, PA-C [place]

## 2019-06-17 DIAGNOSIS — D2272 Melanocytic nevi of left lower limb, including hip: Secondary | ICD-10-CM | POA: Diagnosis not present

## 2019-06-17 DIAGNOSIS — L738 Other specified follicular disorders: Secondary | ICD-10-CM | POA: Diagnosis not present

## 2019-06-17 DIAGNOSIS — D2261 Melanocytic nevi of right upper limb, including shoulder: Secondary | ICD-10-CM | POA: Diagnosis not present

## 2019-06-17 DIAGNOSIS — D2362 Other benign neoplasm of skin of left upper limb, including shoulder: Secondary | ICD-10-CM | POA: Diagnosis not present

## 2019-06-17 DIAGNOSIS — L570 Actinic keratosis: Secondary | ICD-10-CM | POA: Diagnosis not present

## 2019-06-17 DIAGNOSIS — D2271 Melanocytic nevi of right lower limb, including hip: Secondary | ICD-10-CM | POA: Diagnosis not present

## 2019-06-17 DIAGNOSIS — D1801 Hemangioma of skin and subcutaneous tissue: Secondary | ICD-10-CM | POA: Diagnosis not present

## 2019-06-17 DIAGNOSIS — D225 Melanocytic nevi of trunk: Secondary | ICD-10-CM | POA: Diagnosis not present

## 2019-06-17 DIAGNOSIS — L820 Inflamed seborrheic keratosis: Secondary | ICD-10-CM | POA: Diagnosis not present

## 2019-09-18 MED FILL — PREMARIN VAGINAL CREAM-APPL: 0.625 | 90 days supply | Qty: 30 | Fill #1

## 2019-10-05 DIAGNOSIS — Z1231 Encounter for screening mammogram for malignant neoplasm of breast: Secondary | ICD-10-CM | POA: Diagnosis not present

## 2019-10-05 LAB — HM MAMMOGRAPHY

## 2019-10-07 ENCOUNTER — Encounter: Payer: Self-pay | Admitting: Physician Assistant

## 2019-10-15 ENCOUNTER — Other Ambulatory Visit: Payer: Self-pay

## 2019-10-16 ENCOUNTER — Encounter: Payer: Self-pay | Admitting: Obstetrics & Gynecology

## 2019-10-16 ENCOUNTER — Ambulatory Visit (INDEPENDENT_AMBULATORY_CARE_PROVIDER_SITE_OTHER): Payer: 59 | Admitting: Obstetrics & Gynecology

## 2019-10-16 VITALS — BP 136/80 | Ht 64.5 in | Wt 146.0 lb

## 2019-10-16 DIAGNOSIS — Z01419 Encounter for gynecological examination (general) (routine) without abnormal findings: Secondary | ICD-10-CM

## 2019-10-16 DIAGNOSIS — Z780 Asymptomatic menopausal state: Secondary | ICD-10-CM | POA: Diagnosis not present

## 2019-10-16 DIAGNOSIS — N952 Postmenopausal atrophic vaginitis: Secondary | ICD-10-CM

## 2019-10-16 DIAGNOSIS — R87610 Atypical squamous cells of undetermined significance on cytologic smear of cervix (ASC-US): Secondary | ICD-10-CM | POA: Diagnosis not present

## 2019-10-16 MED ORDER — ESTROGENS, CONJUGATED 0.625 MG/GM VA CREA: 0.2500 | TOPICAL_CREAM | VAGINAL | 4 refills | Status: DC

## 2019-10-16 NOTE — Addendum Note (Signed)
Addended by: Nelva Nay on: 10/16/2019 09:09 AM   Modules accepted: Orders

## 2019-10-16 NOTE — Progress Notes (Signed)
Gabrielle Hernandez 1961/03/13 [id number]   History:    59 y.o. G2P2L2 Married.  Grand-daughter is 55 month old.  Remodeling their house x 1 1/2 yr.  RP:  Established patient presenting for annual gyn exam   HPI: Menopause, well on no HRT x 1 year.  No PMB.  No pelvic pain.  Pap negative 11/208.  Well on Estrogen cream for IC.  Breasts normal.  Screening mammo negative 09/2019.  Urine/BMs wnl.  BMI improved to 24.67.  Good fitness.  Healthy nutrition.  Health labs with Fam MD.  Harriet Masson 2016.   Past medical history,surgical history, family history and social history were all reviewed and documented in the EPIC chart.  Gynecologic History Patient's last menstrual period was 10/22/2012.  Obstetric History OB History  Gravida Para Term Preterm AB Living  2 2       2   SAB TAB Ectopic Multiple Live Births               # Outcome Date GA Lbr Len/2nd Weight Sex Delivery Anes PTL Lv  2 Para           1 Para              ROS: A ROS was performed and pertinent positives and negatives are included in the history.  GENERAL: No fevers or chills. HEENT: No change in vision, no earache, sore throat or sinus congestion. NECK: No pain or stiffness. CARDIOVASCULAR: No chest pain or pressure. No palpitations. PULMONARY: No shortness of breath, cough or wheeze. GASTROINTESTINAL: No abdominal pain, nausea, vomiting or diarrhea, melena or bright red blood per rectum. GENITOURINARY: No urinary frequency, urgency, hesitancy or dysuria. MUSCULOSKELETAL: No joint or muscle pain, no back pain, no recent trauma. DERMATOLOGIC: No rash, no itching, no lesions. ENDOCRINE: No polyuria, polydipsia, no heat or cold intolerance. No recent change in weight. HEMATOLOGICAL: No anemia or easy bruising or bleeding. NEUROLOGIC: No headache, seizures, numbness, tingling or weakness. PSYCHIATRIC: No depression, no loss of interest in normal activity or change in sleep pattern.     Exam:   BP 136/80   Ht 5' 4.5" (1.638 m)    Wt 146 lb (66.2 kg)   LMP 10/22/2012   BMI 24.67 kg/m   Body mass index is 24.67 kg/m.  General appearance : Well developed well nourished female. No acute distress HEENT: Eyes: no retinal hemorrhage or exudates,  Neck supple, trachea midline, no carotid bruits, no thyroidmegaly Lungs: Clear to auscultation, no rhonchi or wheezes, or rib retractions  Heart: Regular rate and rhythm, no murmurs or gallops Breast:Examined in sitting and supine position were symmetrical in appearance, no palpable masses or tenderness,  no skin retraction, no nipple inversion, no nipple discharge, no skin discoloration, no axillary or supraclavicular lymphadenopathy Abdomen: no palpable masses or tenderness, no rebound or guarding Extremities: no edema or skin discoloration or tenderness  Pelvic: Vulva: Normal             Vagina: No gross lesions or discharge  Cervix: No gross lesions or discharge.  Pap reflex done.  Uterus  AV, normal size, shape and consistency, non-tender and mobile  Adnexa  Without masses or tenderness  Anus: Normal   Assessment/Plan:  59 y.o. female for annual exam   1. Encounter for routine gynecological examination with Papanicolaou smear of cervix Normal gynecologic exam in menopause.  Pap reflex done.  Breast exam normal.  Screening mammogram December 2020 was negative.  Colonoscopy 2016, on a 10-year  basis.  Health labs with family physician.  Took high-dose vitamin D supplements.  Improved body mass index since last year, now at 24.67.  Continue with fitness and healthy nutrition.  2. Postmenopause Well on no hormone replacement therapy.  No postmenopausal bleeding.  Taking vitamin D supplements, calcium intake of 1200 mg daily recommended.  Regular weightbearing physical activities recommended.  3. Post-menopausal atrophic vaginitis Well on Premarin cream.  No contraindication to continue.  Prescription sent to pharmacy.  Other orders - VITAMIN D PO; Take by mouth. -  conjugated estrogens (PREMARIN) vaginal cream; Place [id number] Applicatorfuls vaginally 2 (two) times a week.  Princess Bruins MD, 8:28 AM 10/16/2019

## 2019-10-16 NOTE — Patient Instructions (Signed)
1. Encounter for routine gynecological examination with Papanicolaou smear of cervix Normal gynecologic exam in menopause.  Pap reflex done.  Breast exam normal.  Screening mammogram December 2020 was negative.  Colonoscopy 2016, on a 10-year basis.  Health labs with family physician.  Took high-dose vitamin D supplements.  Improved body mass index since last year, now at 24.67.  Continue with fitness and healthy nutrition.  2. Postmenopause Well on no hormone replacement therapy.  No postmenopausal bleeding.  Taking vitamin D supplements, calcium intake of 1200 mg daily recommended.  Regular weightbearing physical activities recommended.  3. Post-menopausal atrophic vaginitis Well on Premarin cream.  No contraindication to continue.  Prescription sent to pharmacy.  Other orders - VITAMIN D PO; Take by mouth. - conjugated estrogens (PREMARIN) vaginal cream; Place [id number] Applicatorfuls vaginally 2 (two) times a week.  Gabrielle Hernandez, it was a pleasure seeing you today!  I will inform you of your results as soon as they are available.

## 2019-10-20 LAB — HUMAN PAPILLOMAVIRUS, HIGH RISK: HPV DNA High Risk: NOT DETECTED

## 2019-10-20 LAB — PAP IG W/ RFLX HPV ASCU

## 2019-12-16 DIAGNOSIS — Z03818 Encounter for observation for suspected exposure to other biological agents ruled out: Secondary | ICD-10-CM | POA: Diagnosis not present

## 2019-12-31 MED FILL — PREMARIN VAGINAL CREAM-APPL: 0.625 | 90 days supply | Qty: 30 | Fill #0

## 2020-04-26 ENCOUNTER — Other Ambulatory Visit (HOSPITAL_COMMUNITY): Payer: Self-pay | Admitting: Dermatology

## 2020-04-26 DIAGNOSIS — L282 Other prurigo: Secondary | ICD-10-CM | POA: Diagnosis not present

## 2020-04-26 DIAGNOSIS — L218 Other seborrheic dermatitis: Secondary | ICD-10-CM | POA: Diagnosis not present

## 2020-04-26 MED FILL — FLUOCINONIDE 0.05 % SOLN: 0.05 | 30 days supply | Qty: 60 | Fill #0

## 2020-05-23 MED FILL — PREMARIN VAGINAL CREAM-APPL: 0.625 | 90 days supply | Qty: 30 | Fill #1

## 2020-06-29 DIAGNOSIS — D2362 Other benign neoplasm of skin of left upper limb, including shoulder: Secondary | ICD-10-CM | POA: Diagnosis not present

## 2020-06-29 DIAGNOSIS — D2271 Melanocytic nevi of right lower limb, including hip: Secondary | ICD-10-CM | POA: Diagnosis not present

## 2020-06-29 DIAGNOSIS — L821 Other seborrheic keratosis: Secondary | ICD-10-CM | POA: Diagnosis not present

## 2020-06-29 DIAGNOSIS — L218 Other seborrheic dermatitis: Secondary | ICD-10-CM | POA: Diagnosis not present

## 2020-06-29 DIAGNOSIS — D225 Melanocytic nevi of trunk: Secondary | ICD-10-CM | POA: Diagnosis not present

## 2020-06-29 DIAGNOSIS — D1801 Hemangioma of skin and subcutaneous tissue: Secondary | ICD-10-CM | POA: Diagnosis not present

## 2020-08-24 DIAGNOSIS — H5213 Myopia, bilateral: Secondary | ICD-10-CM | POA: Diagnosis not present

## 2020-10-05 ENCOUNTER — Encounter: Payer: Self-pay | Admitting: Obstetrics & Gynecology

## 2020-10-05 DIAGNOSIS — Z1231 Encounter for screening mammogram for malignant neoplasm of breast: Secondary | ICD-10-CM | POA: Diagnosis not present

## 2020-10-05 MED FILL — PREMARIN VAGINAL CREAM-APPL: 0.625 | 90 days supply | Qty: 30 | Fill #2

## 2020-10-25 ENCOUNTER — Other Ambulatory Visit: Payer: 59

## 2020-10-26 DIAGNOSIS — E042 Nontoxic multinodular goiter: Secondary | ICD-10-CM | POA: Diagnosis not present

## 2020-10-26 DIAGNOSIS — R7303 Prediabetes: Secondary | ICD-10-CM | POA: Diagnosis not present

## 2020-10-26 DIAGNOSIS — E663 Overweight: Secondary | ICD-10-CM | POA: Diagnosis not present

## 2020-10-26 DIAGNOSIS — E785 Hyperlipidemia, unspecified: Secondary | ICD-10-CM | POA: Diagnosis not present

## 2020-11-04 ENCOUNTER — Encounter: Payer: 59 | Admitting: Obstetrics & Gynecology

## 2020-11-08 DIAGNOSIS — G43109 Migraine with aura, not intractable, without status migrainosus: Secondary | ICD-10-CM | POA: Diagnosis not present

## 2020-11-08 MED FILL — FLUOCINONIDE 0.05 % SOLN: 0.05 | 30 days supply | Qty: 60 | Fill #1

## 2020-11-18 ENCOUNTER — Other Ambulatory Visit: Payer: Self-pay | Admitting: Obstetrics & Gynecology

## 2020-11-18 ENCOUNTER — Other Ambulatory Visit: Payer: Self-pay

## 2020-11-18 ENCOUNTER — Encounter: Payer: Self-pay | Admitting: Obstetrics & Gynecology

## 2020-11-18 ENCOUNTER — Ambulatory Visit (INDEPENDENT_AMBULATORY_CARE_PROVIDER_SITE_OTHER): Payer: 59 | Admitting: Obstetrics & Gynecology

## 2020-11-18 VITALS — BP 122/80 | Ht 64.0 in | Wt 153.0 lb

## 2020-11-18 DIAGNOSIS — R87618 Other abnormal cytological findings on specimens from cervix uteri: Secondary | ICD-10-CM | POA: Diagnosis not present

## 2020-11-18 DIAGNOSIS — Z780 Asymptomatic menopausal state: Secondary | ICD-10-CM

## 2020-11-18 DIAGNOSIS — Z01419 Encounter for gynecological examination (general) (routine) without abnormal findings: Secondary | ICD-10-CM | POA: Diagnosis not present

## 2020-11-18 DIAGNOSIS — N952 Postmenopausal atrophic vaginitis: Secondary | ICD-10-CM | POA: Diagnosis not present

## 2020-11-18 DIAGNOSIS — R87610 Atypical squamous cells of undetermined significance on cytologic smear of cervix (ASC-US): Secondary | ICD-10-CM | POA: Diagnosis not present

## 2020-11-18 MED ORDER — ESTROGENS, CONJUGATED 0.625 MG/GM VA CREA: 0.2500 | TOPICAL_CREAM | VAGINAL | 4 refills | Status: DC

## 2020-11-18 NOTE — Addendum Note (Signed)
Addended by: Lorine Bears on: 11/18/2020 04:30 PM   Modules accepted: Orders

## 2020-11-18 NOTE — Progress Notes (Signed)
Gabrielle Hernandez [birth year] [id number]   History:    60 y.o. G2P2L2 Married. Grand-daughter is 65 yr 41 month old. Remodeled their house with a gym.  [id number] presenting for annual gyn exam   AJO:INOMVEHMCNOBS, well on no HRT x 2 years. No PMB. No pelvic pain.  Pap ASCUS/HPV HR neg 10/2019.  Well on Estrogen cream for IC. Breasts normal.  Screening mammo negative 09/2020. Urine/BMs wnl. BMI 26.26.  Good fitness.  Healthy nutrition. Health labs with Fam MD.  Harriet Masson 2016.  Past medical history,surgical history, family history and social history were all reviewed and documented in the EPIC chart.  Gynecologic History Patient's last menstrual period was 10/22/2012.  Obstetric History OB History  Gravida Para Term Preterm AB Living  2 2     0 2  SAB IAB Ectopic Multiple Live Births  0   0        # Outcome Date GA Lbr Len/2nd Weight Sex Delivery Anes PTL Lv  2 Para           1 Para              ROS: A ROS was performed and pertinent positives and negatives are included in the history.  GENERAL: No fevers or chills. HEENT: No change in vision, no earache, sore throat or sinus congestion. NECK: No pain or stiffness. CARDIOVASCULAR: No chest pain or pressure. No palpitations. PULMONARY: No shortness of breath, cough or wheeze. GASTROINTESTINAL: No abdominal pain, nausea, vomiting or diarrhea, melena or bright red blood per rectum. GENITOURINARY: No urinary frequency, urgency, hesitancy or dysuria. MUSCULOSKELETAL: No joint or muscle pain, no back pain, no recent trauma. DERMATOLOGIC: No rash, no itching, no lesions. ENDOCRINE: No polyuria, polydipsia, no heat or cold intolerance. No recent change in weight. HEMATOLOGICAL: No anemia or easy bruising or bleeding. NEUROLOGIC: No headache, seizures, numbness, tingling or weakness. PSYCHIATRIC: No depression, no loss of interest in normal activity or change in sleep pattern.     Exam:   Ht 5\' 4"  (1.626 m)   Wt 153  lb (69.4 kg)   LMP 10/22/2012   BMI 26.26 kg/m   Body mass index is 26.26 kg/m.  General appearance : Well developed well nourished female. No acute distress HEENT: Eyes: no retinal hemorrhage or exudates,  Neck supple, trachea midline, no carotid bruits, no thyroidmegaly Lungs: Clear to auscultation, no rhonchi or wheezes, or rib retractions  Heart: Regular rate and rhythm, no murmurs or gallops Breast:Examined in sitting and supine position were symmetrical in appearance, no palpable masses or tenderness,  no skin retraction, no nipple inversion, no nipple discharge, no skin discoloration, no axillary or supraclavicular lymphadenopathy Abdomen: no palpable masses or tenderness, no rebound or guarding Extremities: no edema or skin discoloration or tenderness  Pelvic: Vulva: Normal             Vagina: No gross lesions or discharge  Cervix: No gross lesions or discharge.  Pap reflex done.  Uterus  AV, normal size, shape and consistency, non-tender and mobile  Adnexa  Without masses or tenderness  Anus: Normal   Assessment/Plan:  60 y.o. female for annual exam   1. Encounter for routine gynecological examination with Papanicolaou smear of cervix Normal gynecologic exam in menopause.  Pap reflex done.  Breasts normal.  Screening mammo 09/2020 Negative.  [place] 2016.  Health labs with Fam MD.  BMI 26.26.  Continue with low calorie/carb diet and intermittent fasting.  Good fitness.  2. ASCUS of  cervix with negative high risk HPV Pap reflex done today.  3. Postmenopause Well on no HRT.  No PMB.  Vit D supplements, Ca++ 1.5 g/d, weight bearing physical activities.  4. Post-menopausal atrophic vaginitis Well on Premarin vaginal cream twice a week.  No CI to continue.  Prescription sent to pharmacy.  Other orders - conjugated estrogens (PREMARIN) vaginal cream; Place 8.59 Applicatorfuls vaginally 2 (two) times a week.  Princess Bruins MD, 3:29 PM 11/18/2020

## 2020-11-23 LAB — PAP IG W/ RFLX HPV ASCU

## 2020-11-28 ENCOUNTER — Encounter: Payer: Self-pay | Admitting: *Deleted

## 2021-01-09 ENCOUNTER — Other Ambulatory Visit (HOSPITAL_COMMUNITY): Payer: Self-pay

## 2021-01-09 MED FILL — Estrogens, Conjugated Vaginal Cream 0.625 MG/GM: VAGINAL | 90 days supply | Qty: 30 | Fill #0 | Status: AC

## 2021-04-18 ENCOUNTER — Other Ambulatory Visit (HOSPITAL_COMMUNITY): Payer: Self-pay

## 2021-04-18 MED FILL — Estrogens, Conjugated Vaginal Cream 0.625 MG/GM: VAGINAL | 90 days supply | Qty: 30 | Fill #1 | Status: AC

## 2021-04-19 ENCOUNTER — Other Ambulatory Visit (HOSPITAL_COMMUNITY): Payer: Self-pay

## 2021-08-08 ENCOUNTER — Other Ambulatory Visit (HOSPITAL_COMMUNITY): Payer: Self-pay

## 2021-08-08 MED ORDER — ESTROGENS CONJUGATED 0.625 MG/GM VA CREA
TOPICAL_CREAM | VAGINAL | 2 refills | Status: DC
  Filled 2021-08-08: qty 30, 90d supply, fill #0
  Filled 2021-10-25: qty 30, 90d supply, fill #1

## 2021-08-11 ENCOUNTER — Other Ambulatory Visit (HOSPITAL_COMMUNITY): Payer: Self-pay

## 2021-09-25 DIAGNOSIS — H5213 Myopia, bilateral: Secondary | ICD-10-CM | POA: Diagnosis not present

## 2021-10-06 DIAGNOSIS — Z1231 Encounter for screening mammogram for malignant neoplasm of breast: Secondary | ICD-10-CM | POA: Diagnosis not present

## 2021-10-10 ENCOUNTER — Encounter: Payer: Self-pay | Admitting: Obstetrics & Gynecology

## 2021-10-25 ENCOUNTER — Other Ambulatory Visit (HOSPITAL_COMMUNITY): Payer: Self-pay

## 2021-11-07 ENCOUNTER — Other Ambulatory Visit: Payer: Self-pay

## 2021-11-07 ENCOUNTER — Telehealth: Payer: Self-pay

## 2021-11-07 ENCOUNTER — Ambulatory Visit (INDEPENDENT_AMBULATORY_CARE_PROVIDER_SITE_OTHER): Payer: 59 | Admitting: Obstetrics & Gynecology

## 2021-11-07 ENCOUNTER — Encounter: Payer: Self-pay | Admitting: Obstetrics & Gynecology

## 2021-11-07 VITALS — BP 124/82

## 2021-11-07 DIAGNOSIS — N63 Unspecified lump in unspecified breast: Secondary | ICD-10-CM

## 2021-11-07 NOTE — Progress Notes (Signed)
° ° °  Gabrielle Hernandez 1960/10/27 [id number]        61 y.o.  G2P0002   RP: Left breast swollen x 1 week  HPI: Left breast swollen x 1 week.  Had itching around the nipple at first.  No tenderness or pain.  No lump felt.  No redness.  No nipple d/c.  No fever.  Last Mammo Neg 10/06/2021.   OB History  Gravida Para Term Preterm AB Living  2 2     0 2  SAB IAB Ectopic Multiple Live Births  0   0   2    # Outcome Date GA Lbr Len/2nd Weight Sex Delivery Anes PTL Lv  2 Para           1 Para             Obstetric Comments  + 1 adopted    Past medical history,surgical history, problem list, medications, allergies, family history and social history were all reviewed and documented in the EPIC chart.   Directed ROS with pertinent positives and negatives documented in the history of present illness/assessment and plan.  Exam:  Vitals:   11/07/21 1420  BP: 124/82   General appearance:  Normal  Breast exam:  Rt breast normal.  No Rt axillary LN felt                        Lt breast:  Mild increase in volume especially at 12 O'Clock, more dense as well.  No erythema.  No lump felt.  No nipple d/c.  Non-tender.  Left axilla Neg.    Assessment/Plan:  61 y.o. G2P0002   1. Swollen Left breast Left breast swollen x 1 week.  Had itching around the nipple at first.  No tenderness or pain.  No lump felt.  No redness.  No nipple d/c.  No fever.  Last Mammo Neg 10/06/2021.  Left breast mildly increased in size and density c/w the Right breast.  Will further investigate with a Left Dx mammo/Lt Breast US.   Other orders - Multiple Vitamins-Minerals (OCUVITE PO); Take by mouth. - VITAMIN D PO; Take by mouth.   Princess Bruins MD, 3:03 PM 11/07/2021

## 2021-11-07 NOTE — Telephone Encounter (Signed)
Left Dx mammo/Let Breast US Received: Today Gabrielle Bruins, MD  P [facility] Triage Left breast swollen with mild itchiness x 1 week.  No lump. No erythema, no nipple d/c.  No fever.  Left Dx mammo/Left Breast US.

## 2021-11-07 NOTE — Telephone Encounter (Signed)
Patient is scheduled for 11/16/21 at 8:15am at [facility]. Order faxed. Patient informed.

## 2021-11-16 ENCOUNTER — Encounter: Payer: Self-pay | Admitting: Obstetrics & Gynecology

## 2021-11-16 DIAGNOSIS — R928 Other abnormal and inconclusive findings on diagnostic imaging of breast: Secondary | ICD-10-CM | POA: Diagnosis not present

## 2021-11-20 ENCOUNTER — Encounter: Payer: Self-pay | Admitting: Obstetrics & Gynecology

## 2021-11-20 DIAGNOSIS — R922 Inconclusive mammogram: Secondary | ICD-10-CM

## 2021-11-20 DIAGNOSIS — N63 Unspecified lump in unspecified breast: Secondary | ICD-10-CM

## 2021-11-24 NOTE — Telephone Encounter (Signed)
Dr.Lavoie replied "Left breast swollen/density at 12 O'Clock.  R/O Inflammatory Lt Breast Cancer. Lt Dx mammo/US didn't pickup abnormalities.Please schedule a Left breast MRI"   Bilateral MRI order placed at [facility], ( Mri's at [place] [place] are not scheduled for one breast, bilateral only)  number given to patient to schedule.

## 2021-11-24 NOTE — Telephone Encounter (Signed)
Will encounter open until patient MRI is scheduled.

## 2021-11-25 ENCOUNTER — Ambulatory Visit
Admission: RE | Admit: 2021-11-25 | Discharge: 2021-11-25 | Disposition: A | Discharge status: Home / Self Care | Payer: 59 | Source: Ambulatory Visit | Attending: Obstetrics & Gynecology | Admitting: Obstetrics & Gynecology

## 2021-11-25 ENCOUNTER — Other Ambulatory Visit: Payer: Self-pay

## 2021-11-25 DIAGNOSIS — N6320 Unspecified lump in the left breast, unspecified quadrant: Secondary | ICD-10-CM | POA: Diagnosis not present

## 2021-11-25 DIAGNOSIS — N63 Unspecified lump in unspecified breast: Secondary | ICD-10-CM

## 2021-11-25 DIAGNOSIS — R922 Inconclusive mammogram: Secondary | ICD-10-CM

## 2021-11-25 MED ORDER — GADOBUTROL 1 MMOL/ML IV SOLN
7.0000 mL | Freq: Once | INTRAVENOUS | Status: AC | PRN
  Administered 2021-11-25: 7 mL via INTRAVENOUS

## 2021-11-27 ENCOUNTER — Telehealth: Payer: Self-pay | Admitting: *Deleted

## 2021-11-27 NOTE — Telephone Encounter (Signed)
Patient scheduled on 11/27/21

## 2021-11-27 NOTE — Telephone Encounter (Signed)
Incoming call from [place].  Ruby called in regard to MRI completed on 11/25/21. Patient needs additional f/u.  Results in Epic. Radiologist wanted to be sure provider aware of report.   Copy of report to Dr. Dellis Filbert.  Will also cc [facility].

## 2021-11-28 NOTE — Telephone Encounter (Signed)
Pt has an appt with Dr Georgette Dover on 12/01/21 at 11:20 am.

## 2021-11-28 NOTE — Telephone Encounter (Signed)
I called patient and spoke with her about result and need for referral. She is fine with that. Message sent to referral coordinator at [place]. She will call patient to schedule.

## 2021-11-28 NOTE — Telephone Encounter (Signed)
Gabrielle Bruins, MD  Burnice Logan, RN; [facility] Triage 16 hours ago (5:28 PM)   MRI:  2 areas (nodular and linear) of indeterminate enhancement to biopsy on the left breast and 1 area of linear indeterminate enhancement to biopsy on the right breast.  Refer to General Surgeon for Breast Biopsies.

## 2021-12-01 ENCOUNTER — Other Ambulatory Visit: Payer: Self-pay | Admitting: Surgery

## 2021-12-01 DIAGNOSIS — E785 Hyperlipidemia, unspecified: Secondary | ICD-10-CM | POA: Insufficient documentation

## 2021-12-01 DIAGNOSIS — R928 Other abnormal and inconclusive findings on diagnostic imaging of breast: Secondary | ICD-10-CM

## 2021-12-01 DIAGNOSIS — N644 Mastodynia: Secondary | ICD-10-CM | POA: Diagnosis not present

## 2021-12-02 ENCOUNTER — Other Ambulatory Visit: Payer: Self-pay | Admitting: Surgery

## 2021-12-02 DIAGNOSIS — R928 Other abnormal and inconclusive findings on diagnostic imaging of breast: Secondary | ICD-10-CM

## 2021-12-07 ENCOUNTER — Other Ambulatory Visit (HOSPITAL_COMMUNITY): Payer: Self-pay

## 2021-12-07 ENCOUNTER — Other Ambulatory Visit: Payer: Self-pay

## 2021-12-07 ENCOUNTER — Other Ambulatory Visit (HOSPITAL_COMMUNITY)
Admission: RE | Admit: 2021-12-07 | Discharge: 2021-12-07 | Disposition: A | Discharge status: Home / Self Care | Payer: 59 | Source: Ambulatory Visit | Attending: Obstetrics & Gynecology | Admitting: Obstetrics & Gynecology

## 2021-12-07 ENCOUNTER — Encounter: Payer: Self-pay | Admitting: Obstetrics & Gynecology

## 2021-12-07 ENCOUNTER — Ambulatory Visit (INDEPENDENT_AMBULATORY_CARE_PROVIDER_SITE_OTHER): Payer: 59 | Admitting: Obstetrics & Gynecology

## 2021-12-07 VITALS — BP 122/78 | HR 74 | Resp 16 | Ht 64.0 in | Wt 160.0 lb

## 2021-12-07 DIAGNOSIS — Z01419 Encounter for gynecological examination (general) (routine) without abnormal findings: Secondary | ICD-10-CM | POA: Insufficient documentation

## 2021-12-07 DIAGNOSIS — N63 Unspecified lump in unspecified breast: Secondary | ICD-10-CM | POA: Diagnosis not present

## 2021-12-07 DIAGNOSIS — R87610 Atypical squamous cells of undetermined significance on cytologic smear of cervix (ASC-US): Secondary | ICD-10-CM

## 2021-12-07 DIAGNOSIS — Z780 Asymptomatic menopausal state: Secondary | ICD-10-CM | POA: Diagnosis not present

## 2021-12-07 MED ORDER — ALPRAZOLAM 0.25 MG PO TABS
0.2500 mg | ORAL_TABLET | ORAL | 0 refills | Status: DC | PRN
Start: 2021-12-07 — End: 2022-01-22
  Filled 2021-12-07: qty 2, 1d supply, fill #0

## 2021-12-07 NOTE — Progress Notes (Signed)
? ? ?ADAMARY SAVARY Jul 31, 1961 [id number] ? ? ?History:    61 y.o. G2P2L2 Married.  Grand-daughter is almost 19 yrs old.    ?RP:  Established patient presenting for annual gyn exam  ?  ?HPI: Postmenopause, well on no HRT x 3 years.  No PMB.  No pelvic pain.  Pap ASCUS/HPV HR neg 10/2019.  Pap Neg in 11/2020.  Pap reflex today.  Well on Estrogen cream for IC. Left Breast feel less swollen.  Bilateral mammo 10/06/2021, MRI 11/25/2021.  Bilateral Breast Bxs scheduled tomorrow.  Would like Xanax pre-procedure.  Urine/BMs wnl. BMI 27.46.  Good fitness, home gym. Healthy nutrition.  Health labs with Fam MD.  Harriet Masson 2016. ? ?Past medical history,surgical history, family history and social history were all reviewed and documented in the EPIC chart. ? ?Gynecologic History ?Patient's last menstrual period was 10/22/2012. ? ?Obstetric History ?OB History  ?Gravida Para Term Preterm AB Living  ?2 2     0 2  ?SAB IAB Ectopic Multiple Live Births  ?0   0   2  ?  ?# Outcome Date GA Lbr Len/2nd Weight Sex Delivery Anes PTL Lv  ?2 Para           ?1 Para           ?  ?Obstetric Comments  ?+ 1 adopted  ? ? ? ?ROS: A ROS was performed and pertinent positives and negatives are included in the history. ? GENERAL: No fevers or chills. HEENT: No change in vision, no earache, sore throat or sinus congestion. NECK: No pain or stiffness. CARDIOVASCULAR: No chest pain or pressure. No palpitations. PULMONARY: No shortness of breath, cough or wheeze. GASTROINTESTINAL: No abdominal pain, nausea, vomiting or diarrhea, melena or bright red blood per rectum. GENITOURINARY: No urinary frequency, urgency, hesitancy or dysuria. MUSCULOSKELETAL: No joint or muscle pain, no back pain, no recent trauma. DERMATOLOGIC: No rash, no itching, no lesions. ENDOCRINE: No polyuria, polydipsia, no heat or cold intolerance. No recent change in weight. HEMATOLOGICAL: No anemia or easy bruising or bleeding. NEUROLOGIC: No headache, seizures, numbness, tingling or  weakness. PSYCHIATRIC: No depression, no loss of interest in normal activity or change in sleep pattern.  ?  ? ?Exam: ? ? ?BP 122/78   Pulse 74   Resp 16   Ht 5\' 4"  (1.626 m)   Wt 160 lb (72.6 kg)   LMP 10/22/2012 Comment: BTL  BMI 27.46 kg/m?  ? ?Body mass index is 27.46 kg/m?. ? ?General appearance : Well developed well nourished female. No acute distress ?HEENT: Eyes: no retinal hemorrhage or exudates,  Neck supple, trachea midline, no carotid bruits, no thyroidmegaly ?Lungs: Clear to auscultation, no rhonchi or wheezes, or rib retractions  ?Heart: Regular rate and rhythm, no murmurs or gallops ?Breast:Examined in sitting and supine position were symmetrical in appearance, no palpable masses or tenderness,  no skin retraction, no nipple inversion, no nipple discharge, no skin discoloration, no axillary or supraclavicular lymphadenopathy ?Abdomen: no palpable masses or tenderness, no rebound or guarding ?Extremities: no edema or skin discoloration or tenderness ? ?Pelvic: Vulva: Normal ?            Vagina: No gross lesions or discharge ? Cervix: No gross lesions or discharge.  Pap reflex done. ? Uterus  AV, normal size, shape and consistency, non-tender and mobile ? Adnexa  Without masses or tenderness ? Anus: Normal ? ? ?Assessment/Plan:  61 y.o. female for annual exam  ? ?1. Encounter for routine gynecological  examination with Papanicolaou smear of cervix ?Postmenopause, well on no HRT x 3 years.  No PMB.  No pelvic pain.  Pap ASCUS/HPV HR neg 10/2019.  Pap Neg in 11/2020.  Pap reflex today.  Left Breast feel less swollen.  Bilateral mammo 10/06/2021, MRI 11/25/2021.  Bilateral Breast Bxs scheduled tomorrow.  Would like Xanax pre-procedure.  Urine/BMs wnl. BMI 27.46.  Good fitness, home gym. Healthy nutrition.  Health labs with Fam MD.  Harriet Masson 2016. ?- Cytology - PAP( [place]) ? ?2. ASCUS of cervix with negative high risk HPV ?- Cytology - PAP( [place]) ? ?3. Postmenopause ?Postmenopause, well on no  HRT x 3 years.  No PMB.  No pelvic pain.  ? ?4. Swollen breast ?MRI of Breasts done on 11/30/2021.  Bilateral Breast Bxs scheduled tomorrow.  Requested pre-procedure Xanax.  Prescription sent to pharmacy for Xanax 0.25 mg/tab take 1-2 tab PO before the breast biopsies. Usage reviewed. ? ?Other orders ?- ALPRAZolam (XANAX) 0.25 MG tablet; Take 1 tablet (0.25 mg total) by mouth as needed for anxiety. Take 1-2 tab PO prior to Bilateral Breast Biopsies tomorrow.  ? ?Princess Bruins MD, 8:10 AM 12/07/2021 ? ?  ?

## 2021-12-08 ENCOUNTER — Ambulatory Visit
Admission: RE | Admit: 2021-12-08 | Discharge: 2021-12-08 | Disposition: A | Discharge status: Home / Self Care | Payer: 59 | Source: Ambulatory Visit | Attending: Surgery | Admitting: Surgery

## 2021-12-08 DIAGNOSIS — R928 Other abnormal and inconclusive findings on diagnostic imaging of breast: Secondary | ICD-10-CM

## 2021-12-08 DIAGNOSIS — N62 Hypertrophy of breast: Secondary | ICD-10-CM | POA: Diagnosis not present

## 2021-12-08 DIAGNOSIS — N6012 Diffuse cystic mastopathy of left breast: Secondary | ICD-10-CM | POA: Diagnosis not present

## 2021-12-08 LAB — CYTOLOGY - PAP: Diagnosis: NEGATIVE

## 2021-12-08 MED ORDER — GADOBUTROL 1 MMOL/ML IV SOLN
7.0000 mL | Freq: Once | INTRAVENOUS | Status: AC | PRN
  Administered 2021-12-08: 7 mL via INTRAVENOUS

## 2021-12-11 ENCOUNTER — Other Ambulatory Visit (HOSPITAL_COMMUNITY): Payer: Self-pay

## 2021-12-11 ENCOUNTER — Other Ambulatory Visit: Payer: Self-pay | Admitting: *Deleted

## 2021-12-11 MED ORDER — FLUCONAZOLE 150 MG PO TABS
150.0000 mg | ORAL_TABLET | Freq: Once | ORAL | 0 refills | Status: AC
  Filled 2021-12-11: qty 1, 1d supply, fill #0

## 2022-01-09 DIAGNOSIS — H6123 Impacted cerumen, bilateral: Secondary | ICD-10-CM | POA: Diagnosis not present

## 2022-01-22 ENCOUNTER — Other Ambulatory Visit (HOSPITAL_COMMUNITY): Payer: Self-pay

## 2022-01-22 ENCOUNTER — Ambulatory Visit (INDEPENDENT_AMBULATORY_CARE_PROVIDER_SITE_OTHER): Payer: 59 | Admitting: Physician Assistant

## 2022-01-22 ENCOUNTER — Encounter: Payer: Self-pay | Admitting: Physician Assistant

## 2022-01-22 VITALS — BP 140/90 | HR 77 | Temp 98.0°F | Ht 64.0 in | Wt 160.0 lb

## 2022-01-22 DIAGNOSIS — E8881 Metabolic syndrome: Secondary | ICD-10-CM

## 2022-01-22 DIAGNOSIS — E663 Overweight: Secondary | ICD-10-CM | POA: Diagnosis not present

## 2022-01-22 DIAGNOSIS — E785 Hyperlipidemia, unspecified: Secondary | ICD-10-CM

## 2022-01-22 DIAGNOSIS — E041 Nontoxic single thyroid nodule: Secondary | ICD-10-CM

## 2022-01-22 DIAGNOSIS — H9203 Otalgia, bilateral: Secondary | ICD-10-CM

## 2022-01-22 DIAGNOSIS — Z0001 Encounter for general adult medical examination with abnormal findings: Secondary | ICD-10-CM

## 2022-01-22 LAB — LIPID PANEL
Cholesterol: 194 mg/dL (ref 0–200)
HDL: 61.6 mg/dL (ref >39.00)
LDL Cholesterol: 118 mg/dL — ABNORMAL HIGH (ref 0–99)
NonHDL: 132.67
Total CHOL/HDL Ratio: 3
Triglycerides: 75 mg/dL (ref 0.0–149.0)
VLDL: 15 mg/dL (ref 0.0–40.0)

## 2022-01-22 LAB — CBC WITH DIFFERENTIAL/PLATELET
Basophils Absolute: 0.1 10*3/uL (ref 0.0–0.1)
Basophils Relative: 1 % (ref 0.0–3.0)
Eosinophils Absolute: 0.1 10*3/uL (ref 0.0–0.7)
Eosinophils Relative: 2.2 % (ref 0.0–5.0)
HCT: 39.4 % (ref 36.0–46.0)
Hemoglobin: 13.2 g/dL (ref 12.0–15.0)
Lymphocytes Relative: 21.5 % (ref 12.0–46.0)
Lymphs Abs: 1.2 10*3/uL (ref 0.7–4.0)
MCHC: 33.4 g/dL (ref 30.0–36.0)
MCV: 84.6 fl (ref 78.0–100.0)
Monocytes Absolute: 0.5 10*3/uL (ref 0.1–1.0)
Monocytes Relative: 9.2 % (ref 3.0–12.0)
Neutro Abs: 3.6 10*3/uL (ref 1.4–7.7)
Neutrophils Relative %: 66.1 % (ref 43.0–77.0)
Platelets: 230 10*3/uL (ref 150.0–400.0)
RBC: 4.66 Mil/uL (ref 3.87–5.11)
RDW: 13.8 % (ref 11.5–15.5)
WBC: 5.5 10*3/uL (ref 4.0–10.5)

## 2022-01-22 LAB — COMPREHENSIVE METABOLIC PANEL
ALT: 20 U/L (ref 0–35)
AST: 22 U/L (ref 0–37)
Albumin: 4.5 g/dL (ref 3.5–5.2)
Alkaline Phosphatase: 65 U/L (ref 39–117)
BUN: 13 mg/dL (ref 6–23)
CO2: 28 mEq/L (ref 19–32)
Calcium: 8.8 mg/dL (ref 8.4–10.5)
Chloride: 103 mEq/L (ref 96–112)
Creatinine, Ser: 0.81 mg/dL (ref 0.40–1.20)
GFR: 79.73 mL/min (ref >60.00)
Glucose, Bld: 91 mg/dL (ref 70–99)
Potassium: 4.4 mEq/L (ref 3.5–5.1)
Sodium: 139 mEq/L (ref 135–145)
Total Bilirubin: 0.4 mg/dL (ref 0.2–1.2)
Total Protein: 6.8 g/dL (ref 6.0–8.3)

## 2022-01-22 LAB — TSH: TSH: 0.75 u[IU]/mL (ref 0.35–5.50)

## 2022-01-22 LAB — HEMOGLOBIN A1C: Hgb A1c MFr Bld: 5.9 % (ref 4.6–6.5)

## 2022-01-22 MED ORDER — IPRATROPIUM BROMIDE 0.03 % NA SOLN
2.0000 | Freq: Two times a day (BID) | NASAL | 1 refills | Status: DC
  Filled 2022-01-22: qty 30, 43d supply, fill #0
  Filled 2022-04-27: qty 30, 43d supply, fill #1

## 2022-01-22 NOTE — Progress Notes (Signed)
? ? ?Subjective:  ?  ?Gabrielle Hernandez is a 61 y.o. female and is here for a comprehensive physical exam. ? ?HPI ? ?Health Maintenance Due  ?Topic Date Due  ? COVID-19 Vaccine (1) Never done  ? ? ?Acute Concerns: ?Ear Concerns ?Pt states that prior to her [place] vacation, she felt as though her ears were stuffy. Reports that while on her trip she was fine, but once she returned she would hear crackling in her ears. She uses claritin 10 mg daily, nasacort 55 mcg nasal spray daily at baseline. She did add in Afrin nasal spray x 3 days which provided minor relief. Becky did have bilateral impacted cerumen cleared out prior to her trip.  While she is managing well, she is open to adjusting this regimen if it means she is provided with additional relief.  ? ?Chronic Issues: ?Hx of Thyroid Nodule  ?Although pt has had this closely monitored by endocrinology, there have been no changes. While she is grateful for this, she still would like to routinely update these labs to be sure nothing is occurring. Denies brittle nails, unintentional weight changes, heart palpitations, hair loss, or heat/cold intolerance.  ? ?HLD ?Due for updated labs. Currently not on any medication for this. ? ?Insulin resistance ?Last A1c was 5.8%. Due for repeat A1c. Just came from week long vacation in [place]. ? ?Health Maintenance: ?Immunizations -- Covid- Due ?Influenza- Due;2020 ?Tdap- ENI;7782 ?Colonoscopy -- UMP;5361 ?Mammogram -- WER;1540 ?PAP -- GQQ;7619 ?Bone Density -- N/A ?Dentistry- UTD ?Ophthalmology- UTD ?Diet -- Eats all food group; trialed intermittent fasting  ?Sleep habits -- No concerns; mild jet lag due to recent trip ?Exercise -- Cardio and strength training-- 7 miles of walking a day ?Weight -- Stable ?Mood -- Stable; No concerns ?Weight history: ?Wt Readings from Last 10 Encounters:  ?01/22/22 160 lb (72.6 kg)  ?12/07/21 160 lb (72.6 kg)  ?11/18/20 153 lb (69.4 kg)  ?10/16/19 146 lb (66.2 kg)  ?06/16/19 159 lb (72.1 kg)   ?05/27/19 158 lb 8 oz (71.9 kg)  ?05/13/19 158 lb 8 oz (71.9 kg)  ?10/07/18 158 lb 6.4 oz (71.8 kg)  ?04/23/18 145 lb (65.8 kg)  ?08/16/17 149 lb (67.6 kg)  ? ?Body mass index is 27.46 kg/m?Marland Kitchen ?Patient's last menstrual period was 10/22/2012. ?Alcohol use:  reports that she does not currently use alcohol. ?Tobacco use:  ?Tobacco Use: Medium Risk  ? Smoking Tobacco Use: Former  ? Smokeless Tobacco Use: Never  ? Passive Exposure: Not on file  ? ? ? ? ?  01/22/2022  ?  7:58 AM  ?Depression screen PHQ 2/9  ?Decreased Interest 0  ?Down, Depressed, Hopeless 0  ?PHQ - 2 Score 0  ? ? ? ?Other providers/specialists: ?Patient Care Team: ?Inda Coke, PA as PCP - General (Physician Assistant) ?Delrae Rend, MD as Consulting Physician (Endocrinology)  ? ? ?PMHx, SurgHx, SocialHx, Medications, and Allergies were reviewed in the Visit Navigator and updated as appropriate.  ? ?Past Medical History:  ?Diagnosis Date  ? Gallstones 1993  ? GERD (gastroesophageal reflux disease)   ? Multinodular goiter 2009  ? Right thyroid nodule. Negative FNA. Followed by Endocrine.  ? Seasonal allergies   ? Superficial basal cell carcinoma on right shoulder 2011  ? ? ? ?Past Surgical History:  ?Procedure Laterality Date  ? CHOLECYSTECTOMY  1993  ? DILATION AND CURETTAGE OF UTERUS    ? ESOPHAGOGASTRODUODENOSCOPY    ? THYROID SURGERY  7/11  ? thyroid biopsy  ? TUBAL LIGATION  1996  ? ? ? ?Family History  ?Problem Relation Age of Onset  ? Hypertension Mother   ? Diabetes Mother   ? Depression Father   ? Hypertension Brother   ? Heart disease Maternal Grandfather   ? Parkinson's disease Maternal Grandfather   ? Coronary artery disease Maternal Grandfather   ? Stomach cancer Paternal Grandmother   ? Colon cancer Neg Hx   ? Kidney disease Neg Hx   ? Gallbladder disease Neg Hx   ? Esophageal cancer Neg Hx   ? ? ?Social History  ? ?Tobacco Use  ? Smoking status: Former  ?  Types: Cigarettes  ?  Start date: 11/13/1989  ? Smokeless tobacco: Never  ?Vaping  Use  ? Vaping Use: Never used  ?Substance Use Topics  ? Alcohol use: Not Currently  ?  Comment: 1 glass of wine a night  ? Drug use: No  ? ? ?Review of Systems:  ? ?Review of Systems  ?Constitutional:  Negative for chills, fever, malaise/fatigue and weight loss.  ?HENT:  Positive for ear pain. Negative for hearing loss, sinus pain and sore throat.   ?Respiratory:  Negative for cough and hemoptysis.   ?Cardiovascular:  Negative for chest pain, palpitations, leg swelling and PND.  ?Gastrointestinal:  Negative for abdominal pain, constipation, diarrhea, heartburn, nausea and vomiting.  ?Genitourinary:  Negative for dysuria, frequency and urgency.  ?Musculoskeletal:  Negative for back pain, myalgias and neck pain.  ?Skin:  Negative for itching and rash.  ?Neurological:  Negative for dizziness, tingling, seizures and headaches.  ?Endo/Heme/Allergies:  Negative for polydipsia.  ?Psychiatric/Behavioral:  Negative for depression. The patient is not nervous/anxious.   ? ?Objective:  ? ?BP 140/90 (BP Location: Left Arm, Patient Position: Sitting, Cuff Size: Normal)   Pulse 77   Temp 98 ?F (36.7 ?C) (Temporal)   Ht '5\' 4"'$  (1.626 m)   Wt 160 lb (72.6 kg)   LMP 10/22/2012 Comment: BTL  SpO2 97%   BMI 27.46 kg/m?  ?Body mass index is 27.46 kg/m?. ? ? ?General Appearance:    Alert, cooperative, no distress, appears stated age  ?Head:    Normocephalic, without obvious abnormality, atraumatic  ?Eyes:    PERRL, conjunctiva/corneas clear, EOM's intact, fundi  ?  benign, both eyes  ?Ears:    Normal TM's and external ear canals, both ears; B/l TM erythema with mild streaking of blood at superior aspect of TM bilaterally   ?Nose:   Nares normal, septum midline, mucosa normal, no drainage  or sinus tenderness  ?Throat:   Lips, mucosa, and tongue normal; teeth and gums normal  ?Neck:   Supple, symmetrical, trachea midline, no adenopathy;  ?  thyroid:  no enlargement/tenderness/nodules; no carotid ?  bruit or JVD  ?Back:      Symmetric, no curvature, ROM normal, no CVA tenderness  ?Lungs:     Clear to auscultation bilaterally, respirations unlabored  ?Chest Wall:    No tenderness or deformity  ? Heart:    Regular rate and rhythm, S1 and S2 normal, no murmur, rub or gallop  ?Breast Exam:    Deferred  ?Abdomen:     Soft, non-tender, bowel sounds active all four quadrants,  ?  no masses, no organomegaly  ?Genitalia:    Deferred  ?Extremities:   Extremities normal, atraumatic, no cyanosis or edema  ?Pulses:   2+ and symmetric all extremities  ?Skin:   Skin color, texture, turgor normal, no rashes or lesions  ?Lymph nodes:   Cervical, supraclavicular,  and axillary nodes normal  ?Neurologic:   CNII-XII intact, normal strength, sensation and reflexes  ?  throughout  ? ? ?Assessment/Plan:  ? ?Encounter for general adult medical examination with abnormal findings ?Today patient counseled on age appropriate routine health concerns for screening and prevention, each reviewed and up to date or declined. Immunizations reviewed and up to date or declined. Labs ordered and reviewed. Risk factors for depression reviewed and negative. Hearing function and visual acuity are intact. ADLs screened and addressed as needed. Functional ability and level of safety reviewed and appropriate. Education, counseling and referrals performed based on assessed risks today. Patient provided with a copy of personalized plan for preventive services. ? ?Thyroid nodule ?Asymptomatic ?Update labs, will make recommendations accordingly  ? ?Insulin resistance; Overweight ?Update labs today ?Encouraged patient to continue participating in healthy eating and regular exercise  ? ?Hyperlipidemia, unspecified hyperlipidemia type ?Update lipid panel, will start medication as indicated by results   ?Consider Calcium score if indicated/desired ? ?Ear pain ?No red flags ?Patient also seen by Dr. Dimas Chyle ?Suspect ETD ?Will trial switching anti-histamine and trialing atrovent nasal  spray, instead of nasacort ?If no relief, or any worsening, low threshold to send to ENT ? ?Patient Counseling: ?'[x]'$    Nutrition: Stressed importance of moderation in sodium/caffeine intake, saturated fat and cholesterol,

## 2022-01-22 NOTE — Patient Instructions (Addendum)
It was great to see you! ? ?Keep an eye on your blood pressure -- it was borderline elevated today 140/90 ? ?Trial a different over the counter antihistamines -- Zyrtec (cetirizine), Allegra (fexofenadine), or Xyzal (levocetirizine) daily. ? ?Trial different nasal spray -- I'm going to send in Atrovent. ?Consider saline nasal spray prior to medication spray. ? ?If no improvement, we can trial oral prednisone or send you to ENT ? ?Please go to the lab for blood work.  ? ?Our office will call you with your results unless you have chosen to receive results via MyChart. ? ?If your blood work is normal we will follow-up each year for physicals and as scheduled for chronic medical problems. ? ?If anything is abnormal we will treat accordingly and get you in for a follow-up. ? ?Take care, ? ?Aldona Bar ?  ?

## 2022-03-21 ENCOUNTER — Other Ambulatory Visit (HOSPITAL_COMMUNITY): Payer: Self-pay

## 2022-03-21 ENCOUNTER — Telehealth: Payer: Self-pay

## 2022-03-21 MED ORDER — ESTROGENS CONJUGATED 0.625 MG/GM VA CREA
TOPICAL_CREAM | VAGINAL | 2 refills | Status: DC
  Filled 2022-03-21: qty 30, 90d supply, fill #0
  Filled 2022-06-04: qty 30, 90d supply, fill #1
  Filled 2022-10-04: qty 30, 90d supply, fill #2

## 2022-03-21 NOTE — Telephone Encounter (Signed)
Patient called requesting refill on Premarin Vaginal Cream.  AEX 3.2.23

## 2022-03-21 NOTE — Telephone Encounter (Signed)
Refill sent.

## 2022-04-23 DIAGNOSIS — D1801 Hemangioma of skin and subcutaneous tissue: Secondary | ICD-10-CM | POA: Diagnosis not present

## 2022-04-23 DIAGNOSIS — L821 Other seborrheic keratosis: Secondary | ICD-10-CM | POA: Diagnosis not present

## 2022-04-23 DIAGNOSIS — L570 Actinic keratosis: Secondary | ICD-10-CM | POA: Diagnosis not present

## 2022-04-27 ENCOUNTER — Other Ambulatory Visit (HOSPITAL_COMMUNITY): Payer: Self-pay

## 2022-05-24 ENCOUNTER — Other Ambulatory Visit: Payer: Self-pay | Admitting: Radiology

## 2022-05-24 ENCOUNTER — Other Ambulatory Visit: Payer: Self-pay | Admitting: Emergency Medicine

## 2022-05-24 DIAGNOSIS — N63 Unspecified lump in unspecified breast: Secondary | ICD-10-CM

## 2022-05-24 DIAGNOSIS — R922 Inconclusive mammogram: Secondary | ICD-10-CM

## 2022-05-25 ENCOUNTER — Other Ambulatory Visit: Payer: Self-pay | Admitting: Surgery

## 2022-05-25 DIAGNOSIS — N63 Unspecified lump in unspecified breast: Secondary | ICD-10-CM

## 2022-05-25 DIAGNOSIS — R922 Inconclusive mammogram: Secondary | ICD-10-CM

## 2022-06-04 ENCOUNTER — Other Ambulatory Visit (HOSPITAL_COMMUNITY): Payer: Self-pay

## 2022-06-13 ENCOUNTER — Other Ambulatory Visit: Payer: 59

## 2022-06-23 ENCOUNTER — Ambulatory Visit
Admission: RE | Admit: 2022-06-23 | Discharge: 2022-06-23 | Disposition: A | Discharge status: Home / Self Care | Payer: 59 | Source: Ambulatory Visit | Attending: Surgery | Admitting: Surgery

## 2022-06-23 DIAGNOSIS — N63 Unspecified lump in unspecified breast: Secondary | ICD-10-CM

## 2022-06-23 DIAGNOSIS — N6489 Other specified disorders of breast: Secondary | ICD-10-CM | POA: Diagnosis not present

## 2022-06-23 DIAGNOSIS — R922 Inconclusive mammogram: Secondary | ICD-10-CM

## 2022-06-23 MED ORDER — GADOBUTROL 1 MMOL/ML IV SOLN
7.0000 mL | Freq: Once | INTRAVENOUS | Status: AC | PRN
  Administered 2022-06-23: 7 mL via INTRAVENOUS

## 2022-06-25 ENCOUNTER — Other Ambulatory Visit: Payer: Self-pay | Admitting: Surgery

## 2022-06-25 DIAGNOSIS — R928 Other abnormal and inconclusive findings on diagnostic imaging of breast: Secondary | ICD-10-CM

## 2022-07-02 ENCOUNTER — Encounter: Payer: Self-pay | Admitting: *Deleted

## 2022-07-06 ENCOUNTER — Other Ambulatory Visit: Payer: 59

## 2022-07-06 ENCOUNTER — Ambulatory Visit
Admission: RE | Admit: 2022-07-06 | Discharge: 2022-07-06 | Disposition: A | Discharge status: Home / Self Care | Payer: 59 | Source: Ambulatory Visit | Attending: Surgery | Admitting: Surgery

## 2022-07-06 DIAGNOSIS — R928 Other abnormal and inconclusive findings on diagnostic imaging of breast: Secondary | ICD-10-CM

## 2022-07-06 DIAGNOSIS — N6022 Fibroadenosis of left breast: Secondary | ICD-10-CM | POA: Diagnosis not present

## 2022-07-06 MED ORDER — GADOBUTROL 1 MMOL/ML IV SOLN
7.0000 mL | Freq: Once | INTRAVENOUS | Status: AC | PRN
  Administered 2022-07-06: 7 mL via INTRAVENOUS

## 2022-09-04 DIAGNOSIS — J029 Acute pharyngitis, unspecified: Secondary | ICD-10-CM | POA: Diagnosis not present

## 2022-09-04 DIAGNOSIS — Z6824 Body mass index (BMI) 24.0-24.9, adult: Secondary | ICD-10-CM | POA: Diagnosis not present

## 2022-09-05 DIAGNOSIS — L720 Epidermal cyst: Secondary | ICD-10-CM | POA: Diagnosis not present

## 2022-09-05 DIAGNOSIS — L813 Cafe au lait spots: Secondary | ICD-10-CM | POA: Diagnosis not present

## 2022-09-05 DIAGNOSIS — D485 Neoplasm of uncertain behavior of skin: Secondary | ICD-10-CM | POA: Diagnosis not present

## 2022-09-05 DIAGNOSIS — D2362 Other benign neoplasm of skin of left upper limb, including shoulder: Secondary | ICD-10-CM | POA: Diagnosis not present

## 2022-09-05 DIAGNOSIS — L821 Other seborrheic keratosis: Secondary | ICD-10-CM | POA: Diagnosis not present

## 2022-09-05 DIAGNOSIS — D1801 Hemangioma of skin and subcutaneous tissue: Secondary | ICD-10-CM | POA: Diagnosis not present

## 2022-09-05 DIAGNOSIS — D225 Melanocytic nevi of trunk: Secondary | ICD-10-CM | POA: Diagnosis not present

## 2022-09-20 ENCOUNTER — Encounter: Payer: Self-pay | Admitting: *Deleted

## 2022-09-25 DIAGNOSIS — H5213 Myopia, bilateral: Secondary | ICD-10-CM | POA: Diagnosis not present

## 2022-09-25 DIAGNOSIS — H35363 Drusen (degenerative) of macula, bilateral: Secondary | ICD-10-CM | POA: Diagnosis not present

## 2022-10-04 ENCOUNTER — Other Ambulatory Visit (HOSPITAL_COMMUNITY): Payer: Self-pay

## 2022-11-21 DIAGNOSIS — Z1231 Encounter for screening mammogram for malignant neoplasm of breast: Secondary | ICD-10-CM | POA: Diagnosis not present

## 2022-11-28 ENCOUNTER — Encounter: Payer: Self-pay | Admitting: Obstetrics & Gynecology

## 2023-01-21 ENCOUNTER — Telehealth: Payer: Self-pay

## 2023-01-21 DIAGNOSIS — N6022 Fibroadenosis of left breast: Secondary | ICD-10-CM

## 2023-01-21 NOTE — Telephone Encounter (Signed)
Patient called asking if we can place order for Breast MRI recommended. "The patient was instructed to return for a bilateral breast MRI in 6 months, per protocol."  Okay to place order?   ADDENDUM REPORT: 07/09/2022 16:59   ADDENDUM: Pathology revealed BENIGN FIBROGLANDULAR BREAST TISSUE WITH FOCAL SCLEROSING ADENOSIS of the LEFT breast, lower inner, (barbell clip). This was found to be concordant by Dr. Emmaline Kluver.   Pathology results were discussed with the patient by telephone by Collene Mares, RN Nurse Navigator. The patient reported doing well after the biopsy with tenderness and bruising at the site. Post biopsy instructions and care were reviewed and questions were answered. The patient was encouraged to call [facility] for any additional concerns.   The patient was instructed to return for a bilateral breast MRI in 6 months, per protocol, and to have annual screening mammography in February 2024 at [facility] in [place], [place].   Pathology results reported by Rene Kocher, RN on 07/09/2022.

## 2023-01-22 NOTE — Telephone Encounter (Signed)
Genia Del, MD  You1 hour ago (12:21 PM)   Yes to Breast MRI. Dr L     Order placed. Patient informed that order placed and provided with phone number for [facility] to call and schedule/answer screening questions.

## 2023-01-31 NOTE — Telephone Encounter (Signed)
Bilateral Breast MRI scheduled 02/19/2023 at [facility].  Will route to [facility] for PA inquiry.

## 2023-02-08 ENCOUNTER — Other Ambulatory Visit: Payer: Self-pay | Admitting: Obstetrics & Gynecology

## 2023-02-08 NOTE — Telephone Encounter (Signed)
MyChart message sent to patient:  prescription for premarin vaginal cream is non-formulary.  Asked patient to let us know if she wanted [place] to check with Dr. Fuller Plan about changing her prescription (generic estradiol cream is less expensive).

## 2023-02-11 ENCOUNTER — Other Ambulatory Visit (HOSPITAL_COMMUNITY): Payer: Self-pay

## 2023-02-11 NOTE — Telephone Encounter (Signed)
Per mychart msg from 02/08/2023. Pt states she will gladly accept generic as long as it has the same effects. If not, she will pay OOP for brand.    Last AEX 12/07/2021--scheduled 04/22/2023 Last mammo 11/21/2022-birads 2 benign, MRI scheduled 02/19/2023.   Rx pend

## 2023-02-13 ENCOUNTER — Other Ambulatory Visit (HOSPITAL_COMMUNITY): Payer: Self-pay

## 2023-02-13 MED ORDER — PREMARIN 0.625 MG/GM VA CREA
1.0000 | TOPICAL_CREAM | VAGINAL | 0 refills | Status: DC
  Filled 2023-02-13: qty 30, 90d supply, fill #0

## 2023-02-13 NOTE — Telephone Encounter (Signed)
Pt notified via mychart msg from 02/08/2023.

## 2023-02-19 ENCOUNTER — Ambulatory Visit
Admission: RE | Admit: 2023-02-19 | Discharge: 2023-02-19 | Disposition: A | Discharge status: Home / Self Care | Payer: 59 | Source: Ambulatory Visit | Attending: Obstetrics & Gynecology | Admitting: Obstetrics & Gynecology

## 2023-02-19 DIAGNOSIS — N6314 Unspecified lump in the right breast, lower inner quadrant: Secondary | ICD-10-CM | POA: Diagnosis not present

## 2023-02-19 DIAGNOSIS — N6323 Unspecified lump in the left breast, lower outer quadrant: Secondary | ICD-10-CM | POA: Diagnosis not present

## 2023-02-19 DIAGNOSIS — N6022 Fibroadenosis of left breast: Secondary | ICD-10-CM

## 2023-02-19 DIAGNOSIS — R92323 Mammographic fibroglandular density, bilateral breasts: Secondary | ICD-10-CM | POA: Diagnosis not present

## 2023-02-19 DIAGNOSIS — N6324 Unspecified lump in the left breast, lower inner quadrant: Secondary | ICD-10-CM | POA: Diagnosis not present

## 2023-02-19 MED ORDER — GADOPICLENOL 0.5 MMOL/ML IV SOLN
7.5000 mL | Freq: Once | INTRAVENOUS | Status: AC | PRN
  Administered 2023-02-19: 7.5 mL via INTRAVENOUS

## 2023-04-16 ENCOUNTER — Encounter (INDEPENDENT_AMBULATORY_CARE_PROVIDER_SITE_OTHER): Payer: 59 | Admitting: Ophthalmology

## 2023-04-16 DIAGNOSIS — H43813 Vitreous degeneration, bilateral: Secondary | ICD-10-CM

## 2023-04-16 DIAGNOSIS — H3530 Unspecified macular degeneration: Secondary | ICD-10-CM | POA: Insufficient documentation

## 2023-04-16 DIAGNOSIS — H3554 Dystrophies primarily involving the retinal pigment epithelium: Secondary | ICD-10-CM | POA: Diagnosis not present

## 2023-04-16 DIAGNOSIS — H353132 Nonexudative age-related macular degeneration, bilateral, intermediate dry stage: Secondary | ICD-10-CM

## 2023-04-22 ENCOUNTER — Encounter: Payer: Self-pay | Admitting: Obstetrics & Gynecology

## 2023-04-22 ENCOUNTER — Other Ambulatory Visit (HOSPITAL_COMMUNITY): Payer: Self-pay

## 2023-04-22 ENCOUNTER — Other Ambulatory Visit (HOSPITAL_COMMUNITY)
Admission: RE | Admit: 2023-04-22 | Discharge: 2023-04-22 | Disposition: A | Discharge status: Home / Self Care | Payer: 59 | Source: Ambulatory Visit | Attending: Obstetrics & Gynecology | Admitting: Obstetrics & Gynecology

## 2023-04-22 ENCOUNTER — Ambulatory Visit (INDEPENDENT_AMBULATORY_CARE_PROVIDER_SITE_OTHER): Payer: 59 | Admitting: Obstetrics & Gynecology

## 2023-04-22 VITALS — BP 122/82 | HR 88 | Ht 63.75 in | Wt 161.0 lb

## 2023-04-22 DIAGNOSIS — Z01419 Encounter for gynecological examination (general) (routine) without abnormal findings: Secondary | ICD-10-CM | POA: Diagnosis not present

## 2023-04-22 DIAGNOSIS — Z780 Asymptomatic menopausal state: Secondary | ICD-10-CM

## 2023-04-22 MED ORDER — PREMARIN 0.625 MG/GM VA CREA
1.0000 | TOPICAL_CREAM | VAGINAL | 4 refills | Status: DC
  Filled 2023-04-22: qty 30, 52d supply, fill #0
  Filled 2023-04-23: qty 30, 60d supply, fill #0
  Filled 2023-04-25: qty 30, 53d supply, fill #0
  Filled 2023-04-29: qty 30, 90d supply, fill #0
  Filled 2023-07-29: qty 30, 90d supply, fill #1
  Filled 2024-01-06: qty 30, 90d supply, fill #2

## 2023-04-22 NOTE — Progress Notes (Signed)
Gabrielle Hernandez [birth year] [id number]   History:    62 y.o. G2P2L2 Married.  Grand-daughter is 16 yrs old.    RP:  Established patient presenting for annual gyn exam    HPI: Postmenopause, well on no HRT.  No PMB.  No pelvic pain. Pap ASCUS/HPV HR neg 10/2019.  Pap Neg in 12/2021.  Pap reflex today.  Well on Estrogen cream for IC. Breasts normal.  MRI of Breasts Neg 02/2023.  Repeat screen Mammo 11/2023 recommended. Urine/BMs wnl. BMI 27.85.  Good fitness, home gym. Healthy nutrition.  Health labs with Fam MD.  Alen Bleacher 2016, repeat at 12 yrs.    Past medical history,surgical history, family history and social history were all reviewed and documented in the EPIC chart.  Gynecologic History Patient's last menstrual period was 10/22/2012.  Obstetric History OB History  Gravida Para Term Preterm AB Living  2 2 2    0 2  SAB IAB Ectopic Multiple Live Births  0   0   2    # Outcome Date GA Lbr Len/2nd Weight Sex Type Anes PTL Lv  2 Term           1 Term             Obstetric Comments  + 1 adopted     ROS: A ROS was performed and pertinent positives and negatives are included in the history. GENERAL: No fevers or chills. HEENT: No change in vision, no earache, sore throat or sinus congestion. NECK: No pain or stiffness. CARDIOVASCULAR: No chest pain or pressure. No palpitations. PULMONARY: No shortness of breath, cough or wheeze. GASTROINTESTINAL: No abdominal pain, nausea, vomiting or diarrhea, melena or bright red blood per rectum. GENITOURINARY: No urinary frequency, urgency, hesitancy or dysuria. MUSCULOSKELETAL: No joint or muscle pain, no back pain, no recent trauma. DERMATOLOGIC: No rash, no itching, no lesions. ENDOCRINE: No polyuria, polydipsia, no heat or cold intolerance. No recent change in weight. HEMATOLOGICAL: No anemia or easy bruising or bleeding. NEUROLOGIC: No headache, seizures, numbness, tingling or weakness. PSYCHIATRIC: No depression, no loss of interest in normal  activity or change in sleep pattern.     Exam:   BP 122/82   Pulse 88   Ht 5' 3.75" (1.619 m)   Wt 161 lb (73 kg)   LMP 10/22/2012 Comment: BTL, sexually active  SpO2 99%   BMI 27.85 kg/m   Body mass index is 27.85 kg/m.  General appearance : Well developed well nourished female. No acute distress HEENT: Eyes: no retinal hemorrhage or exudates,  Neck supple, trachea midline, no carotid bruits, no thyroidmegaly Lungs: Clear to auscultation, no rhonchi or wheezes, or rib retractions  Heart: Regular rate and rhythm, no murmurs or gallops Breast:Examined in sitting and supine position were symmetrical in appearance, no palpable masses or tenderness,  no skin retraction, no nipple inversion, no nipple discharge, no skin discoloration, no axillary or supraclavicular lymphadenopathy Abdomen: no palpable masses or tenderness, no rebound or guarding Extremities: no edema or skin discoloration or tenderness  Pelvic: Vulva: Normal             Vagina: No gross lesions or discharge  Cervix: No gross lesions or discharge.  Pap reflex done.  Uterus  AV, normal size, shape and consistency, non-tender and mobile  Adnexa  Without masses or tenderness  Anus: Normal   Assessment/Plan:  62 y.o. female for annual exam   1. Encounter for routine gynecological examination with Papanicolaou smear of cervix Postmenopause, well on  no HRT.  No PMB.  No pelvic pain. Pap ASCUS/HPV HR neg 10/2019.  Pap Neg in 12/2021.  Pap reflex today.  Well on Estrogen cream for IC. Breasts normal.  MRI of Breasts Neg 02/2023.  Repeat screen Mammo 11/2023 recommended. Urine/BMs wnl. BMI 27.85.  Good fitness, home gym. Healthy nutrition.  Health labs with Fam MD.  Alen Bleacher 2016, repeat at 12 yrs. - Cytology - PAP( )  2. Postmenopause  Postmenopause, well on no HRT.  No PMB.  No pelvic pain. Well on Estrogen cream for IC. No CI to continue.  Prescription sent to pharmacy. - conjugated estrogens (PREMARIN) vaginal  cream; Place 1/4 applicatorful vaginally twice a week.   Genia Del MD, 8:15 AM

## 2023-04-23 ENCOUNTER — Other Ambulatory Visit (HOSPITAL_COMMUNITY): Payer: Self-pay

## 2023-04-23 LAB — CYTOLOGY - PAP
Adequacy: ABSENT
Diagnosis: NEGATIVE

## 2023-04-25 ENCOUNTER — Other Ambulatory Visit (HOSPITAL_COMMUNITY): Payer: Self-pay

## 2023-04-29 ENCOUNTER — Other Ambulatory Visit (HOSPITAL_COMMUNITY): Payer: Self-pay

## 2023-04-29 ENCOUNTER — Other Ambulatory Visit: Payer: Self-pay

## 2023-05-16 ENCOUNTER — Encounter (INDEPENDENT_AMBULATORY_CARE_PROVIDER_SITE_OTHER): Payer: 59 | Admitting: Ophthalmology

## 2023-05-16 DIAGNOSIS — H3554 Dystrophies primarily involving the retinal pigment epithelium: Secondary | ICD-10-CM

## 2023-05-16 DIAGNOSIS — H353132 Nonexudative age-related macular degeneration, bilateral, intermediate dry stage: Secondary | ICD-10-CM

## 2023-05-16 DIAGNOSIS — H43813 Vitreous degeneration, bilateral: Secondary | ICD-10-CM | POA: Diagnosis not present

## 2023-05-29 NOTE — Progress Notes (Signed)
Subjective:    Gabrielle Hernandez is a 62 y.o. female and is here for a comprehensive physical exam.  HPI  There are no preventive care reminders to display for this patient.  Acute Concerns: Sinus congestion She has had an upper respiratory illness for one week and is taking OTC decongestant for this. Symptoms worse in the morning.  Chronic Issues: GERD Took Nexium recently for heartburn and symptoms resolved.  Allergies Treated with loratadine 10 mg daily.  Anxiety She took as-needed medication previously for this. Has not experienced any acute stress recently. Believes it has been building slowly. She is interested in going back on as-needed anxiolytic. Denies anxious, ruminating thoughts, SI/HI.  Chest Pain Has been having intermittent burning sensation in chest for seconds at a time. This has happened a few times over the past few weeks. She does not check blood pressure during these episodes. Has not experienced this feeling in some time. Lifestyle, diet have not changed. No change in caffeine intake. Her husband is a cardiologist and he told her that he does not feel like her symptom(s) are related to her heart  Labs are pending. Denies swelling in ankles/legs.  Health Maintenance: Immunizations -- UTD on tetanus vaccine. Colonoscopy -- Last completed 12/24/14. Entire examined colon normal. Recommended repeat in 2026. Mammogram -- Negative on 02/19/23. PAP -- Negative on 04/22/23. Bone Density -- N/A Diet -- Healthy Exercise -- Usually regular but hasn't been exercising due to recent illness.  Sleep habits -- Wakes up occasionally in the middle of the night. She has occasional hot flashes. Mood -- Stable  UTD with dentist? - Yes UTD with eye doctor? - Yes. Had recent cataract diagnosis.  Weight history: Wt Readings from Last 10 Encounters:  06/05/23 160 lb 4 oz (72.7 kg)  04/22/23 161 lb (73 kg)  01/22/22 160 lb (72.6 kg)  12/07/21 160 lb (72.6 kg)   11/18/20 153 lb (69.4 kg)  10/16/19 146 lb (66.2 kg)  06/16/19 159 lb (72.1 kg)  05/27/19 158 lb 8 oz (71.9 kg)  05/13/19 158 lb 8 oz (71.9 kg)  10/07/18 158 lb 6.4 oz (71.8 kg)   Body mass index is 27.51 kg/m. Patient's last menstrual period was 10/22/2012.  Alcohol use:  reports current alcohol use of about 4.0 standard drinks of alcohol per week.  Tobacco use:  Tobacco Use: Medium Risk (06/05/2023)   Patient History    Smoking Tobacco Use: Former    Smokeless Tobacco Use: Never    Passive Exposure: Not on file   Eligible for lung cancer screening? no     06/05/2023    9:57 AM  Depression screen PHQ 2/9  Decreased Interest 0  Down, Depressed, Hopeless 0  PHQ - 2 Score 0  Altered sleeping 0  Tired, decreased energy 3  Change in appetite 0  Feeling bad or failure about yourself  0  Trouble concentrating 0  Moving slowly or fidgety/restless 0  PHQ-9 Score 3  Difficult doing work/chores Not difficult at all     Other providers/specialists: Patient Care Team: Jarold Motto, [place] as PCP - General (Physician Assistant) Talmage Coin, MD as Consulting Physician (Endocrinology)    PMHx, SurgHx, SocialHx, Medications, and Allergies were reviewed in the Visit Navigator and updated as appropriate.   Past Medical History:  Diagnosis Date   Anxiety July 2024   Gallstones 1993   GERD (gastroesophageal reflux disease)    Macular degeneration    Multinodular goiter 2009   Right thyroid nodule.  Negative FNA. Followed by Endocrine.   Seasonal allergies    Superficial basal cell carcinoma on right shoulder 2011     Past Surgical History:  Procedure Laterality Date   CHOLECYSTECTOMY  10/09/1991   DILATION AND CURETTAGE OF UTERUS     ESOPHAGOGASTRODUODENOSCOPY     THYROID SURGERY  04/07/2010   thyroid biopsy   TUBAL LIGATION  10/08/1994     Family History  Problem Relation Age of Onset   Hypertension Mother    Diabetes Mother        well-controlled    Depression Father    Hypertension Brother    Heart disease Maternal Grandfather    Parkinson's disease Maternal Grandfather    Coronary artery disease Maternal Grandfather    Stomach cancer Paternal Grandmother     Social History   Tobacco Use   Smoking status: Former    Types: Cigarettes    Start date: 11/13/1989   Smokeless tobacco: Never  Vaping Use   Vaping status: Never Used  Substance Use Topics   Alcohol use: Yes    Alcohol/week: 4.0 standard drinks of alcohol    Types: 4 Glasses of wine per week    Comment: 1 glass of wine a night on weekends   Drug use: No    Review of Systems:   Review of Systems  Constitutional:  Negative for chills, fever, malaise/fatigue and weight loss.  HENT:  Positive for congestion (Head). Negative for hearing loss, sinus pain and sore throat.   Respiratory:  Negative for cough, hemoptysis and shortness of breath.   Cardiovascular:  Positive for chest pain. Negative for palpitations, leg swelling and PND.  Gastrointestinal:  Negative for abdominal pain, constipation, diarrhea, heartburn, nausea and vomiting.  Genitourinary:  Negative for dysuria, frequency and urgency.  Musculoskeletal:  Negative for back pain, myalgias and neck pain.  Skin:  Negative for itching and rash.  Neurological:  Negative for dizziness, tingling, seizures and headaches.  Endo/Heme/Allergies:  Negative for polydipsia.  Psychiatric/Behavioral:  Negative for depression. The patient is nervous/anxious.     Objective:   BP 126/80 (BP Location: Left Arm, Patient Position: Sitting, Cuff Size: Normal)   Pulse 88   Temp 97.8 F (36.6 C) (Temporal)   Ht 5\' 4"  (1.626 m)   Wt 160 lb 4 oz (72.7 kg)   LMP 10/22/2012 Comment: BTL, sexually active  SpO2 98%   BMI 27.51 kg/m  Body mass index is 27.51 kg/m.   General Appearance:    Alert, cooperative, no distress, appears stated age  Head:    Normocephalic, without obvious abnormality, atraumatic  Eyes:    PERRL,  conjunctiva/corneas clear, EOM's intact, fundi    benign, both eyes  Ears:    Normal TM's and external ear canals, both ears  Nose:   Nares normal, septum midline, mucosa normal, no drainage    or sinus tenderness  Throat:   Lips, mucosa, and tongue normal; teeth and gums normal  Neck:   Supple, symmetrical, trachea midline, no adenopathy;    thyroid:  no enlargement/tenderness/nodules; no carotid   bruit or JVD  Back:     Symmetric, no curvature, ROM normal, no CVA tenderness  Lungs:     Clear to auscultation bilaterally, respirations unlabored  Chest Wall:    No tenderness or deformity   Heart:    Regular rate and rhythm, S1 and S2 normal, no murmur, rub or gallop  Breast Exam:    Deferred  Abdomen:     Soft,  non-tender, bowel sounds active all four quadrants,    no masses, no organomegaly  Genitalia:    Deferred   Extremities:   Extremities normal, atraumatic, no cyanosis or edema  Pulses:   2+ and symmetric all extremities  Skin:   Skin color, texture, turgor normal, no rashes or lesions  Lymph nodes:   Cervical, supraclavicular, and axillary nodes normal  Neurologic:   CNII-XII intact, normal strength, sensation and reflexes    throughout    Assessment/Plan:   Routine physical examination Today patient counseled on age appropriate routine health concerns for screening and prevention, each reviewed and up to date or declined. Immunizations reviewed and up to date or declined. Labs ordered and reviewed. Risk factors for depression reviewed and negative. Hearing function and visual acuity are intact. ADLs screened and addressed as needed. Functional ability and level of safety reviewed and appropriate. Education, counseling and referrals performed based on assessed risks today. Patient provided with a copy of personalized plan for preventive services.  Insulin resistance Update A1c and provide recommendations accordingly  Hyperlipidemia, unspecified hyperlipidemia type Update lipid  panel and provide recommendations accordingly  Overweight Continue efforts at healthy lifestyle  Sinus congestion Suspect viral upper respiratory infection (URI) No indication for antibiotic(s) on my exam Recommend continued supportive care and follow-up if symptom(s) do not improve or if worsen  Chest pain, unspecified type No red flags Declines cardiology referral, EKG and calcium score Will update TSH - history of thyroid nodules in the past Follow-up if new/worsening  Anxiety Trial as needed ativan 0.5 mg for severe anxiety episodes Denies SI/HI Declines talk therapy If lack of improvement, recommend reaching out  Gastroesophageal reflux disease, unspecified whether esophagitis present Overall stable at this time Continue to monitor and use as needed medication  Follow-up as needed   I,Alexander Ruley,acting as a scribe for [employer], PA.,have documented all relevant documentation on the behalf of Jarold Motto, PA,as directed by  Jarold Motto, PA while in the presence of Jarold Motto, [place].  I, Jarold Motto, [place], have reviewed all documentation for this visit. The documentation on 06/05/23 for the exam, diagnosis, procedures, and orders are all accurate and complete.  Jarold Motto, PA-C [facility]

## 2023-06-05 ENCOUNTER — Other Ambulatory Visit (HOSPITAL_COMMUNITY): Payer: Self-pay

## 2023-06-05 ENCOUNTER — Encounter: Payer: Self-pay | Admitting: Physician Assistant

## 2023-06-05 ENCOUNTER — Ambulatory Visit (INDEPENDENT_AMBULATORY_CARE_PROVIDER_SITE_OTHER): Payer: 59 | Admitting: Physician Assistant

## 2023-06-05 VITALS — BP 126/80 | HR 88 | Temp 97.8°F | Ht 64.0 in | Wt 160.2 lb

## 2023-06-05 DIAGNOSIS — R079 Chest pain, unspecified: Secondary | ICD-10-CM | POA: Diagnosis not present

## 2023-06-05 DIAGNOSIS — E88819 Insulin resistance, unspecified: Secondary | ICD-10-CM | POA: Diagnosis not present

## 2023-06-05 DIAGNOSIS — E663 Overweight: Secondary | ICD-10-CM | POA: Diagnosis not present

## 2023-06-05 DIAGNOSIS — K219 Gastro-esophageal reflux disease without esophagitis: Secondary | ICD-10-CM

## 2023-06-05 DIAGNOSIS — E042 Nontoxic multinodular goiter: Secondary | ICD-10-CM | POA: Insufficient documentation

## 2023-06-05 DIAGNOSIS — R0981 Nasal congestion: Secondary | ICD-10-CM

## 2023-06-05 DIAGNOSIS — E785 Hyperlipidemia, unspecified: Secondary | ICD-10-CM

## 2023-06-05 DIAGNOSIS — F419 Anxiety disorder, unspecified: Secondary | ICD-10-CM | POA: Diagnosis not present

## 2023-06-05 DIAGNOSIS — Z0000 Encounter for general adult medical examination without abnormal findings: Secondary | ICD-10-CM

## 2023-06-05 LAB — CBC WITH DIFFERENTIAL/PLATELET
Basophils Absolute: 0.1 10*3/uL (ref 0.0–0.1)
Basophils Relative: 0.7 % (ref 0.0–3.0)
Eosinophils Absolute: 0.3 10*3/uL (ref 0.0–0.7)
Eosinophils Relative: 3.7 % (ref 0.0–5.0)
HCT: 41.7 % (ref 36.0–46.0)
Hemoglobin: 13.5 g/dL (ref 12.0–15.0)
Lymphocytes Relative: 16.9 % (ref 12.0–46.0)
Lymphs Abs: 1.3 10*3/uL (ref 0.7–4.0)
MCHC: 32.3 g/dL (ref 30.0–36.0)
MCV: 85.4 fl (ref 78.0–100.0)
Monocytes Absolute: 0.7 10*3/uL (ref 0.1–1.0)
Monocytes Relative: 9.5 % (ref 3.0–12.0)
Neutro Abs: 5.4 10*3/uL (ref 1.4–7.7)
Neutrophils Relative %: 69.2 % (ref 43.0–77.0)
Platelets: 271 10*3/uL (ref 150.0–400.0)
RBC: 4.88 Mil/uL (ref 3.87–5.11)
RDW: 13.6 % (ref 11.5–15.5)
WBC: 7.8 10*3/uL (ref 4.0–10.5)

## 2023-06-05 LAB — LIPID PANEL
Cholesterol: 191 mg/dL (ref 0–200)
HDL: 56.2 mg/dL (ref >39.00)
LDL Cholesterol: 117 mg/dL — ABNORMAL HIGH (ref 0–99)
NonHDL: 134.8
Total CHOL/HDL Ratio: 3
Triglycerides: 88 mg/dL (ref 0.0–149.0)
VLDL: 17.6 mg/dL (ref 0.0–40.0)

## 2023-06-05 LAB — TSH: TSH: 1.12 u[IU]/mL (ref 0.35–5.50)

## 2023-06-05 LAB — COMPREHENSIVE METABOLIC PANEL
ALT: 14 U/L (ref 0–35)
AST: 17 U/L (ref 0–37)
Albumin: 4.2 g/dL (ref 3.5–5.2)
Alkaline Phosphatase: 72 U/L (ref 39–117)
BUN: 12 mg/dL (ref 6–23)
CO2: 29 mEq/L (ref 19–32)
Calcium: 9.2 mg/dL (ref 8.4–10.5)
Chloride: 101 mEq/L (ref 96–112)
Creatinine, Ser: 0.75 mg/dL (ref 0.40–1.20)
GFR: 86.61 mL/min (ref >60.00)
Glucose, Bld: 91 mg/dL (ref 70–99)
Potassium: 4.2 mEq/L (ref 3.5–5.1)
Sodium: 138 mEq/L (ref 135–145)
Total Bilirubin: 0.5 mg/dL (ref 0.2–1.2)
Total Protein: 7.2 g/dL (ref 6.0–8.3)

## 2023-06-05 LAB — HEMOGLOBIN A1C: Hgb A1c MFr Bld: 5.9 % (ref 4.6–6.5)

## 2023-06-05 MED ORDER — LORAZEPAM 0.5 MG PO TABS
0.5000 mg | ORAL_TABLET | Freq: Two times a day (BID) | ORAL | 1 refills | Status: DC | PRN
  Filled 2023-06-05: qty 30, 15d supply, fill #0

## 2023-06-05 NOTE — Patient Instructions (Signed)
It was great to see you!  Trial the ativan and keep me posted if this does anything for your symptoms  Please go to the lab for blood work.   Our office will call you with your results unless you have chosen to receive results via MyChart.  If your blood work is normal we will follow-up each year for physicals and as scheduled for chronic medical problems.  If anything is abnormal we will treat accordingly and get you in for a follow-up.  Take care,  Lelon Mast

## 2023-06-27 ENCOUNTER — Encounter: Payer: Self-pay | Admitting: Physician Assistant

## 2023-06-27 DIAGNOSIS — Z83518 Family history of other specified eye disorder: Secondary | ICD-10-CM | POA: Diagnosis not present

## 2023-06-27 DIAGNOSIS — H3530 Unspecified macular degeneration: Secondary | ICD-10-CM

## 2023-06-27 DIAGNOSIS — H353131 Nonexudative age-related macular degeneration, bilateral, early dry stage: Secondary | ICD-10-CM | POA: Diagnosis not present

## 2023-06-27 DIAGNOSIS — H2513 Age-related nuclear cataract, bilateral: Secondary | ICD-10-CM | POA: Diagnosis not present

## 2023-06-28 NOTE — Telephone Encounter (Signed)
Please see message, okay to order lab work?

## 2023-07-03 ENCOUNTER — Other Ambulatory Visit (INDEPENDENT_AMBULATORY_CARE_PROVIDER_SITE_OTHER): Payer: 59

## 2023-07-03 DIAGNOSIS — H3530 Unspecified macular degeneration: Secondary | ICD-10-CM

## 2023-07-03 LAB — IBC + FERRITIN
Ferritin: 105.3 ng/mL (ref 10.0–291.0)
Iron: 95 ug/dL (ref 42–145)
Saturation Ratios: 27.7 % (ref 20.0–50.0)
TIBC: 343 ug/dL (ref 250.0–450.0)
Transferrin: 245 mg/dL (ref 212.0–360.0)

## 2023-07-03 LAB — VITAMIN D 25 HYDROXY (VIT D DEFICIENCY, FRACTURES): VITD: 29.17 ng/mL — ABNORMAL LOW (ref 30.00–100.00)

## 2023-07-22 ENCOUNTER — Encounter (INDEPENDENT_AMBULATORY_CARE_PROVIDER_SITE_OTHER): Payer: 59 | Admitting: Ophthalmology

## 2023-08-14 DIAGNOSIS — L309 Dermatitis, unspecified: Secondary | ICD-10-CM | POA: Diagnosis not present

## 2023-08-14 DIAGNOSIS — L820 Inflamed seborrheic keratosis: Secondary | ICD-10-CM | POA: Diagnosis not present

## 2023-08-14 DIAGNOSIS — L245 Irritant contact dermatitis due to other chemical products: Secondary | ICD-10-CM | POA: Diagnosis not present

## 2023-10-07 DIAGNOSIS — H2513 Age-related nuclear cataract, bilateral: Secondary | ICD-10-CM | POA: Diagnosis not present

## 2023-10-07 DIAGNOSIS — H353123 Nonexudative age-related macular degeneration, left eye, advanced atrophic without subfoveal involvement: Secondary | ICD-10-CM | POA: Diagnosis not present

## 2023-10-07 DIAGNOSIS — H353111 Nonexudative age-related macular degeneration, right eye, early dry stage: Secondary | ICD-10-CM | POA: Diagnosis not present

## 2023-10-30 ENCOUNTER — Encounter: Payer: Self-pay | Admitting: Nurse Practitioner

## 2023-10-30 ENCOUNTER — Other Ambulatory Visit (HOSPITAL_COMMUNITY): Payer: Self-pay

## 2023-10-30 ENCOUNTER — Ambulatory Visit: Payer: 59 | Admitting: Nurse Practitioner

## 2023-10-30 VITALS — BP 130/78 | HR 75 | Ht 64.0 in | Wt 168.2 lb

## 2023-10-30 DIAGNOSIS — R194 Change in bowel habit: Secondary | ICD-10-CM | POA: Diagnosis not present

## 2023-10-30 DIAGNOSIS — K219 Gastro-esophageal reflux disease without esophagitis: Secondary | ICD-10-CM

## 2023-10-30 MED ORDER — FAMOTIDINE 20 MG PO TABS
20.0000 mg | ORAL_TABLET | Freq: Every day | ORAL | 3 refills | Status: AC
  Filled 2023-10-30: qty 30, 30d supply, fill #0

## 2023-10-30 NOTE — Patient Instructions (Addendum)
Acid Reflux  Below are some measures you can take to hopefully improve acid reflux symptoms . We may have discussed some of these today in the office. Not everything on this list may apply to you   --If you are taking anti-reflux ( GERD) medication be sure to take it 30 minutes to one hour before breakfast or dinner if you take it in the afternoon.  --Avoid late meals / bedtime snacks.   --Avoid trigger foods ( foods which you know tend to aggravate you reflux symptoms). Some common trigger foods include spicy foods, fatty foods, acidic foods, chocolate and caffeine.  --Elevate the head of bed 6-8 inches on blocks or bricks. If not able to elevate the head of the bed consider purchasing a wedge pillow to sleep on.    --Weight reduction / maintain a healthy BMI ( body mass index) may be help with reflux symptoms  --Sometimes with the above mentioned "lifestyle changes" patients are able to reduce the amount of GERD medications they take. Our goal is to have you on the lowest effective dose of medication   We have sent the following medications to your pharmacy for you to pick up at your convenience: Famotidine 20 mg daily  Take Benefiber daily (Handout given)  Call back in 4 weeks if not better  _______________________________________________________  If your blood pressure at your visit was 140/90 or greater, please contact your primary care physician to follow up on this.  _______________________________________________________  If you are age 61 or older, your body mass index should be between 23-30. Your Body mass index is 28.87 kg/m. If this is out of the aforementioned range listed, please consider follow up with your Primary Care Provider.  If you are age 58 or younger, your body mass index should be between 19-25. Your Body mass index is 28.87 kg/m. If this is out of the aformentioned range listed, please consider follow up with your Primary Care Provider.    ________________________________________________________  The [facility] providers would like to encourage you to use [facility] to communicate with providers for non-urgent requests or questions.  Due to long hold times on the telephone, sending your provider a message by [facility] may be a faster and more efficient way to get a response.  Please allow 48 business hours for a response.  Please remember that this is for non-urgent requests.  _______________________________________________________   I appreciate the  opportunity to care for you  Thank You   Midge Minium

## 2023-10-30 NOTE — Progress Notes (Signed)
Brief Narrative 63 y.o. yo female with a past medical history not limited to anxiety,  GERD, vitamin D deficiency, cholecystectomy   ASSESSMENT    Bowel changes with soft unformed stool.  No change in stool frequency compared to her baseline of once daily.   Cause not clear at this point but no alarm features such as weight loss or blood in stool  GERD Takes Pepcid and Tums as needed ( about 4 times a week)  Colon cancer screening. Up to date.   See PMH for any additional medical history   PLAN   --Recommend that she try taking Pepcid 20 mg on a daily basis. She prefers not to take PPI on a regular basis.  --Anti-reflux measures - see AVS --For change in stool consistency recommended trial of Benefiber ( it may cause worsening bloating but worth trying) . If bowel movements not getting back to baseline over next few weeks then she will call office to schedule a colonoscopy. Otherwise, screening colonoscopy due March 2026 --She will also let us know if GERD symptoms do not improve with anti-reflux measures and daily Pepcid    HPI   Chief complaint : Bowel changes  Brief GI History: Patient known remotely to Dr. Leone Payor . Last seen in 2016 at time of her screening colonoscopy.   Interval History:  Gabrielle Hernandez comes in with concern for bowel changes.  Over the summer after returning from [place] her stool consistency changed from formed to soft/unformed.  She describes stool consistency as being like soft serve ice cream.  Rarely has a formed stool.  She cannot attribute the bowel habit change to any changes in her diet or medication.  She does not use artificial sweeteners.  She does drink probiotic protein shakes about every other day but that predates bowel changes.  She is averaging 1 bowel movement a day.  She has not seen any blood in her stool.  She has no associated abdominal pain.  She does have some bloating . Her weight is stable.  Gabrielle Hernandez has history of GERD.  She takes  famotidine and TUMs as needed which is about 4 times a week. Sometimes she will take a short course of Prilosec but prefers not to be on a dailiy PPI  Her reflux symptoms are triggered by red wine, red sauces and coffee.   GI History / Studies   **May not be a complete list of studies  Screening colonoscopy March 2016 --Normal  Labs   TSH 1.12    Latest Ref Rng & Units 06/05/2023   10:30 AM 01/22/2022    8:32 AM 06/16/2019    8:39 AM  CBC  WBC 4.0 - 10.5 K/uL 7.8  5.5  5.6   Hemoglobin 12.0 - 15.0 g/dL 13.0  86.5  78.4   Hematocrit 36.0 - 46.0 % 41.7  39.4  41.7   Platelets 150.0 - 400.0 K/uL 271.0  230.0  209.0     Lab Results  Component Value Date   LIPASE 40 10/12/2009      Latest Ref Rng & Units 06/05/2023   10:30 AM 01/22/2022    8:32 AM 06/16/2019    8:39 AM  CMP  Glucose 70 - 99 mg/dL 91  91  96   BUN 6 - 23 mg/dL 12  13  14    Creatinine 0.40 - 1.20 mg/dL 6.96  2.95  2.84   Sodium 135 - 145 mEq/L 138  139  140   Potassium  3.5 - 5.1 mEq/L 4.2  4.4  4.9   Chloride 96 - 112 mEq/L 101  103  102   CO2 19 - 32 mEq/L 29  28  30    Calcium 8.4 - 10.5 mg/dL 9.2  8.8  9.7   Total Protein 6.0 - 8.3 g/dL 7.2  6.8  7.1   Total Bilirubin 0.2 - 1.2 mg/dL 0.5  0.4  0.5   Alkaline Phos 39 - 117 U/L 72  65  81   AST 0 - 37 U/L 17  22  19    ALT 0 - 35 U/L 14  20  17      Past Medical History:  Diagnosis Date   Anxiety July 2024   Gallstones 1993   GERD (gastroesophageal reflux disease)    Macular degeneration    Multinodular goiter 2009   Right thyroid nodule. Negative FNA. Followed by Endocrine.   Seasonal allergies    Superficial basal cell carcinoma on right shoulder 2011   Past Surgical History:  Procedure Laterality Date   CHOLECYSTECTOMY  10/09/1991   DILATION AND CURETTAGE OF UTERUS     ESOPHAGOGASTRODUODENOSCOPY     THYROID SURGERY  04/07/2010   thyroid biopsy   TUBAL LIGATION  10/08/1994   Family History  Problem Relation Age of Onset   Hypertension Mother     Diabetes Mother        well-controlled   Depression Father    Hypertension Brother    Heart disease Maternal Grandfather    Parkinson's disease Maternal Grandfather    Coronary artery disease Maternal Grandfather    Stomach cancer Paternal Grandmother    Colon cancer Neg Hx    Esophageal cancer Neg Hx    Colon polyps Neg Hx    Social History   Tobacco Use   Smoking status: Former    Types: Cigarettes    Start date: 11/13/1989   Smokeless tobacco: Never  Vaping Use   Vaping status: Never Used  Substance Use Topics   Alcohol use: Yes    Alcohol/week: 4.0 standard drinks of alcohol    Types: 4 Glasses of wine per week    Comment: 1 glass of wine a night on weekends   Drug use: No   Current Outpatient Medications  Medication Sig Dispense Refill   conjugated estrogens (PREMARIN) vaginal cream Place 1/4 applicatorful vaginally twice a week. 30 g 4   fluticasone (FLONASE) 50 MCG/ACT nasal spray Place into the nose.     loratadine (CLARITIN) 10 MG tablet Take 10 mg by mouth daily.     LORazepam (ATIVAN) 0.5 MG tablet Take 1 tablet (0.5 mg total) by mouth 2 (two) times daily as needed for anxiety. 30 tablet 1   Multiple Vitamins-Minerals (OCUVITE PO) Take by mouth.     No current facility-administered medications for this visit.   Allergies  Allergen Reactions   Latex Itching    Pt gets itchy with contact with latex     Review of Systems: Positive for anxiety and vision changes.  All other systems reviewed and negative except where noted in HPI.   Wt Readings from Last 3 Encounters:  10/30/23 168 lb 3.2 oz (76.3 kg)  06/05/23 160 lb 4 oz (72.7 kg)  04/22/23 161 lb (73 kg)    Physical Exam:  BP 130/78   Pulse 75   Ht 5\' 4"  (1.626 m)   Wt 168 lb 3.2 oz (76.3 kg)   LMP 10/22/2012 Comment: BTL, sexually active  SpO2 98%  BMI 28.87 kg/m  Constitutional:  Pleasant, generally well appearing female in no acute distress. Psychiatric:  Normal mood and affect. Behavior  is normal. EENT: Pupils normal.  Conjunctivae are normal. No scleral icterus. Neck supple.  Cardiovascular: Normal rate, regular rhythm.  Pulmonary/chest: Effort normal and breath sounds normal. No wheezing, rales or rhonchi. Abdominal: Soft, nondistended, nontender. Bowel sounds active throughout. There are no masses palpable. No hepatomegaly. Neurological: Alert and oriented to person place and time.   Willette Cluster, NP  10/30/2023, 10:06 AM  Cc:  Referring Provider Jarold Motto, PA

## 2023-11-26 ENCOUNTER — Encounter: Payer: Self-pay | Admitting: Physician Assistant

## 2023-11-26 DIAGNOSIS — Z1231 Encounter for screening mammogram for malignant neoplasm of breast: Secondary | ICD-10-CM | POA: Diagnosis not present

## 2023-11-26 LAB — HM MAMMOGRAPHY

## 2023-12-10 DIAGNOSIS — D485 Neoplasm of uncertain behavior of skin: Secondary | ICD-10-CM | POA: Diagnosis not present

## 2023-12-10 DIAGNOSIS — L218 Other seborrheic dermatitis: Secondary | ICD-10-CM | POA: Diagnosis not present

## 2023-12-10 DIAGNOSIS — L821 Other seborrheic keratosis: Secondary | ICD-10-CM | POA: Diagnosis not present

## 2023-12-10 DIAGNOSIS — D225 Melanocytic nevi of trunk: Secondary | ICD-10-CM | POA: Diagnosis not present

## 2023-12-10 DIAGNOSIS — L570 Actinic keratosis: Secondary | ICD-10-CM | POA: Diagnosis not present

## 2023-12-10 DIAGNOSIS — D2362 Other benign neoplasm of skin of left upper limb, including shoulder: Secondary | ICD-10-CM | POA: Diagnosis not present

## 2024-01-06 ENCOUNTER — Other Ambulatory Visit (HOSPITAL_COMMUNITY): Payer: Self-pay

## 2024-01-07 ENCOUNTER — Other Ambulatory Visit (HOSPITAL_COMMUNITY): Payer: Self-pay

## 2024-01-28 NOTE — Progress Notes (Unsigned)
 Chief Complaint: Follow-up of bowel habit changes Primary GI MD: Dr. Willy Harvest  HPI: Discussed the use of AI scribe software for clinical note transcription with the patient, who gave verbal consent to proceed.  History of Present Illness Gabrielle Hernandez "Gabrielle Hernandez" is a 63 year old female who presents with bowel changes and soft, unformed stools. She initially consulted with Maureen Sour in January.  She has been experiencing bowel changes characterized by soft, unformed stools. She used Benefiber, taking a heaping scoop twice a day as directed, but noted no improvement in her symptoms.  Her bowel movements usually occur once a day, though not consistently every day. The stools are very soft and not formed, requiring significant toilet paper to clean. During a recent two-week trip to [place], her bowel habits remained unchanged, except for one episode of diarrhea followed by three to four days without a bowel movement, and then a single normal bowel movement.  She sometimes feels constipated, experiencing difficulty in passing stools where she feels backed up, leading to loose stools that are hard to clean up. Despite dietary changes during her trip, her symptoms persisted.  She has identified certain foods, such as sweet potatoes and pineapples, as potentially bothersome, and she tries to avoid them.   PREVIOUS GI WORKUP   Screening colonoscopy March 2016 --Normal  Past Medical History:  Diagnosis Date   Anxiety July 2024   Gallstones 1993   GERD (gastroesophageal reflux disease)    Macular degeneration    Multinodular goiter 2009   Right thyroid  nodule. Negative FNA. Followed by Endocrine.   Seasonal allergies    Superficial basal cell carcinoma on right shoulder 2011    Past Surgical History:  Procedure Laterality Date   CHOLECYSTECTOMY  10/09/1991   DILATION AND CURETTAGE OF UTERUS     ESOPHAGOGASTRODUODENOSCOPY     THYROID  SURGERY  04/07/2010   thyroid  biopsy   TUBAL LIGATION   10/08/1994    Current Outpatient Medications  Medication Sig Dispense Refill   Calcium Carbonate Antacid (TUMS PO) Take by mouth as needed.     conjugated estrogens  (PREMARIN ) vaginal cream Place 1/4 applicatorful vaginally twice a week. 30 g 4   famotidine  (PEPCID ) 20 MG tablet Take 1 tablet (20 mg total) by mouth daily. 30 tablet 3   fluticasone (FLONASE) 50 MCG/ACT nasal spray Place into the nose.     loratadine (CLARITIN) 10 MG tablet Take 10 mg by mouth daily.     LORazepam  (ATIVAN ) 0.5 MG tablet Take 1 tablet (0.5 mg total) by mouth 2 (two) times daily as needed for anxiety. 30 tablet 1   Multiple Vitamins-Minerals (OCUVITE PO) Take by mouth.     Na Sulfate-K Sulfate-Mg Sulfate concentrate (SUPREP) 17.5-3.13-1.6 GM/177ML SOLN Take 1 kit (354 mLs total) by mouth once for 1 dose. 354 mL 0   No current facility-administered medications for this visit.    Allergies as of 01/29/2024 - Review Complete 01/29/2024  Allergen Reaction Noted   Latex Itching 10/20/2014    Family History  Problem Relation Age of Onset   Hypertension Mother    Diabetes Mother        well-controlled   Depression Father    Hypertension Brother    Heart disease Maternal Grandfather    Parkinson's disease Maternal Grandfather    Coronary artery disease Maternal Grandfather    Stomach cancer Paternal Grandmother    Colon cancer Neg Hx    Esophageal cancer Neg Hx    Colon polyps Neg Hx  Social History   Socioeconomic History   Marital status: Married    Spouse name: Not on file   Number of children: 3   Years of education: Not on file   Highest education level: Not on file  Occupational History   Occupation: Environmental health practitioner  Tobacco Use   Smoking status: Former    Types: Cigarettes    Start date: 11/13/1989   Smokeless tobacco: Never  Vaping Use   Vaping status: Never Used  Substance and Sexual Activity   Alcohol use: Yes    Alcohol/week: 4.0 standard drinks of alcohol    Types:  4 Glasses of wine per week    Comment: 1 glass of wine a night on weekends   Drug use: No   Sexual activity: Yes    Partners: Male    Birth control/protection: Post-menopausal, Surgical    Comment: 1st intercourse- 17, partners- 5, btl  Other Topics Concern   Not on file  Social History Narrative   Married to Dr. Alexandria Angel of cardiology   2 biologic adult sons one adopted daughter   Works part-time as an Environmental health practitioner   Social Drivers of Corporate investment banker Strain: Not on Ship broker Insecurity: Not on file  Transportation Needs: Not on file  Physical Activity: Not on file  Stress: Not on file  Social Connections: Not on file  Intimate Partner Violence: Not on file    Review of Systems:    Constitutional: No weight loss, fever, chills, weakness or fatigue HEENT: Eyes: No change in vision               Ears, Nose, Throat:  No change in hearing or congestion Skin: No rash or itching Cardiovascular: No chest pain, chest pressure or palpitations   Respiratory: No SOB or cough Gastrointestinal: See HPI and otherwise negative Genitourinary: No dysuria or change in urinary frequency Neurological: No headache, dizziness or syncope Musculoskeletal: No new muscle or joint pain Hematologic: No bleeding or bruising Psychiatric: No history of depression or anxiety    Physical Exam:  Vital signs: BP (!) 170/90 (BP Location: Left Arm, Patient Position: Sitting, Cuff Size: Normal)   Pulse 80   Ht 5' 3.75" (1.619 m) Comment: height measured without shoes  Wt 163 lb (73.9 kg)   LMP 10/22/2012 Comment: BTL, sexually active  BMI 28.20 kg/m   Constitutional: NAD, alert and cooperative Head:  Normocephalic and atraumatic. Eyes:   PEERL, EOMI. No icterus. Conjunctiva pink. Respiratory: Respirations even and unlabored. Lungs clear to auscultation bilaterally.   No wheezes, crackles, or rhonchi.  Cardiovascular:  Regular rate and rhythm. No peripheral edema,  cyanosis or pallor.  Gastrointestinal:  Soft, nondistended, nontender. No rebound or guarding. Normal bowel sounds. No appreciable masses or hepatomegaly. Rectal:  Declines Msk:  Symmetrical without gross deformities. Without edema, no deformity or joint abnormality.  Neurologic:  Alert and  oriented x4;  grossly normal neurologically.  Skin:   Dry and intact without significant lesions or rashes. Psychiatric: Oriented to person, place and time. Demonstrates good judgement and reason without abnormal affect or behaviors.   RELEVANT LABS AND IMAGING: CBC    Component Value Date/Time   WBC 7.8 06/05/2023 1030   RBC 4.88 06/05/2023 1030   HGB 13.5 06/05/2023 1030   HCT 41.7 06/05/2023 1030   PLT 271.0 06/05/2023 1030   MCV 85.4 06/05/2023 1030   MCHC 32.3 06/05/2023 1030   RDW 13.6 06/05/2023 1030   LYMPHSABS 1.3 06/05/2023  1030   MONOABS 0.7 06/05/2023 1030   EOSABS 0.3 06/05/2023 1030   BASOSABS 0.1 06/05/2023 1030    CMP     Component Value Date/Time   NA 138 06/05/2023 1030   NA 139 08/13/2018 0000   K 4.2 06/05/2023 1030   CL 101 06/05/2023 1030   CO2 29 06/05/2023 1030   GLUCOSE 91 06/05/2023 1030   BUN 12 06/05/2023 1030   BUN 10 08/13/2018 0000   CREATININE 0.75 06/05/2023 1030   CALCIUM 9.2 06/05/2023 1030   PROT 7.2 06/05/2023 1030   ALBUMIN 4.2 06/05/2023 1030   AST 17 06/05/2023 1030   ALT 14 06/05/2023 1030   ALKPHOS 72 06/05/2023 1030   BILITOT 0.5 06/05/2023 1030   GFRNONAA 82 02/23/2008 0939   GFRAA 100 02/23/2008 0939     Assessment/Plan:   Change in bowel habits Seen January 2025 by Mai Schwalbe, NP.  Bowel changes with soft unformed stools.  Recommended trial of Benefiber.  No significant improvement on Benefiber with continuing to feel backed up/constipated.  Patient is due for colonoscopy next year and would like to get it done early for peace of mind with her change in bowel habits.  I feel like this is reasonable. - Add in MiraLAX to bowel  regimen (1 capful once daily and adjust dose based on response) - Squatty potty to help with bowel movements - Schedule colonoscopy - I thoroughly discussed the procedure with the patient (at bedside) to include nature of the procedure, alternatives, benefits, and risks (including but not limited to bleeding, infection, perforation, anesthesia/cardiac pulmonary complications).  Patient verbalized understanding and gave verbal consent to proceed with procedure.   GERD On Pepcid  20 Mg once daily and Tums as needed for breakthrough symptoms -Continue lifestyle modifications - Continue Pepcid  20 Mg once daily  Hypertension BP today noted to be 170/90 in office today. No symptoms. Not an issue for her at home.  Suzanna Erp, PA-C [facility] 01/29/2024, 10:55 AM  Cc: Alexander Iba, [place]

## 2024-01-29 ENCOUNTER — Ambulatory Visit (INDEPENDENT_AMBULATORY_CARE_PROVIDER_SITE_OTHER): Admitting: Gastroenterology

## 2024-01-29 ENCOUNTER — Other Ambulatory Visit (HOSPITAL_COMMUNITY): Payer: Self-pay

## 2024-01-29 ENCOUNTER — Encounter: Payer: Self-pay | Admitting: Gastroenterology

## 2024-01-29 VITALS — BP 170/90 | HR 80 | Ht 63.75 in | Wt 163.0 lb

## 2024-01-29 DIAGNOSIS — R194 Change in bowel habit: Secondary | ICD-10-CM | POA: Diagnosis not present

## 2024-01-29 DIAGNOSIS — R030 Elevated blood-pressure reading, without diagnosis of hypertension: Secondary | ICD-10-CM

## 2024-01-29 DIAGNOSIS — K219 Gastro-esophageal reflux disease without esophagitis: Secondary | ICD-10-CM | POA: Diagnosis not present

## 2024-01-29 MED ORDER — NA SULFATE-K SULFATE-MG SULF 17.5-3.13-1.6 GM/177ML PO SOLN
1.0000 | Freq: Once | ORAL | 0 refills | Status: AC
  Filled 2024-01-29: qty 354, 1d supply, fill #0

## 2024-01-29 NOTE — Patient Instructions (Signed)
 You have been scheduled for a colonoscopy. Please follow written instructions given to you at your visit today.   If you use inhalers (even only as needed), please bring them with you on the day of your procedure.  DO NOT TAKE 7 DAYS PRIOR TO TEST- Trulicity (dulaglutide) Ozempic, Wegovy (semaglutide) Mounjaro (tirzepatide) Bydureon Bcise (exanatide extended release)  DO NOT TAKE 1 DAY PRIOR TO YOUR TEST Rybelsus (semaglutide) Adlyxin (lixisenatide) Victoza (liraglutide) Byetta (exanatide) ___________________________________________________________________________  Please follow up as needed if symptoms increase or worsen   Due to recent changes in healthcare laws, you may see the results of your imaging and laboratory studies on MyChart before your provider has had a chance to review them.  We understand that in some cases there may be results that are confusing or concerning to you. Not all laboratory results come back in the same time frame and the provider may be waiting for multiple results in order to interpret others.  Please give us  48 hours in order for your provider to thoroughly review all the results before contacting the office for clarification of your results.   _______________________________________________________  If your blood pressure at your visit was 140/90 or greater, please contact your primary care physician to follow up on this.  _______________________________________________________  If you are age 61 or older, your body mass index should be between 23-30. Your Body mass index is 28.2 kg/m. If this is out of the aforementioned range listed, please consider follow up with your Primary Care Provider.  If you are age 37 or younger, your body mass index should be between 19-25. Your Body mass index is 28.2 kg/m. If this is out of the aformentioned range listed, please consider follow up with your Primary Care Provider.    ________________________________________________________  The  GI providers would like to encourage you to use MYCHART to communicate with providers for non-urgent requests or questions.  Due to long hold times on the telephone, sending your provider a message by [facility] may be a faster and more efficient way to get a response.  Please allow 48 business hours for a response.  Please remember that this is for non-urgent requests.  _______________________________________________________ Thank you for trusting me with your gastrointestinal care!   Suzanna Erp, PA

## 2024-02-12 ENCOUNTER — Encounter: Payer: Self-pay | Admitting: Internal Medicine

## 2024-02-13 ENCOUNTER — Encounter (HOSPITAL_COMMUNITY): Payer: Self-pay

## 2024-02-20 ENCOUNTER — Encounter: Payer: Self-pay | Admitting: Internal Medicine

## 2024-02-20 ENCOUNTER — Ambulatory Visit (AMBULATORY_SURGERY_CENTER): Admitting: Internal Medicine

## 2024-02-20 VITALS — BP 131/69 | HR 60 | Temp 97.2°F | Resp 11 | Ht 63.75 in | Wt 163.0 lb

## 2024-02-20 DIAGNOSIS — K5730 Diverticulosis of large intestine without perforation or abscess without bleeding: Secondary | ICD-10-CM | POA: Diagnosis not present

## 2024-02-20 DIAGNOSIS — R194 Change in bowel habit: Secondary | ICD-10-CM

## 2024-02-20 DIAGNOSIS — K6289 Other specified diseases of anus and rectum: Secondary | ICD-10-CM

## 2024-02-20 MED ORDER — SODIUM CHLORIDE 0.9 % IV SOLN: 500.0000 mL | INTRAVENOUS | Status: DC

## 2024-02-20 NOTE — Progress Notes (Signed)
 Sedate, gd SR, tolerated procedure well, VSS, report to RN

## 2024-02-20 NOTE — Op Note (Signed)
 [facility] Patient Name: Gabrielle Hernandez Procedure Date: 02/20/2024 7:56 AM MRN: [id number] Endoscopist: Kenney Peacemaker , MD, [id number] Age: 63 Referring MD:  Date of Birth: 11/29/1960 Gender: Female Account #: [id number] Procedure:                Colonoscopy Indications:              Change in bowel habits Medicines:                Monitored Anesthesia Care Procedure:                Pre-Anesthesia Assessment:                           - Prior to the procedure, a History and Physical                            was performed, and patient medications and                            allergies were reviewed. The patient's tolerance of                            previous anesthesia was also reviewed. The risks                            and benefits of the procedure and the sedation                            options and risks were discussed with the patient.                            All questions were answered, and informed consent                            was obtained. Prior Anticoagulants: The patient has                            taken no anticoagulant or antiplatelet agents. ASA                            Grade Assessment: II - A patient with mild systemic                            disease. After reviewing the risks and benefits,                            the patient was deemed in satisfactory condition to                            undergo the procedure.                           After obtaining informed consent, the colonoscope  was passed under direct vision. Throughout the                            procedure, the patient's blood pressure, pulse, and                            oxygen saturations were monitored continuously. The                            Olympus Scope [id number] was introduced through the                            anus and advanced to the the cecum, identified by                            appendiceal orifice and  ileocecal valve. The                            colonoscopy was performed without difficulty. The                            patient tolerated the procedure well. The quality                            of the bowel preparation was good. The terminal                            ileum, ileocecal valve, appendiceal orifice, and                            rectum were photographed. The bowel preparation                            used was SUPREP via split dose instruction. Scope In: 8:03:50 AM Scope Out: 8:23:38 AM Scope Withdrawal Time: 0 hours 15 minutes 4 seconds  Total Procedure Duration: 0 hours 19 minutes 48 seconds  Findings:                 The perianal and digital rectal examinations were                            normal.                           A few small-mouthed diverticula were found in the                            sigmoid colon.                           Anal papilla(e) were hypertrophied.                           The terminal ileum appeared normal.  The exam was otherwise without abnormality on                            direct and retroflexion views. Complications:            No immediate complications. Estimated Blood Loss:     Estimated blood loss: none. Impression:               - Diverticulosis in the sigmoid colon.                           - Anal papilla(e) were hypertrophied.                           - The examined portion of the ileum was normal.                           - The examination was otherwise normal on direct                            and retroflexion views.                           - No specimens collected. Recommendation:           - Patient has a contact number available for                            emergencies. The signs and symptoms of potential                            delayed complications were discussed with the                            patient. Return to normal activities tomorrow.                             Written discharge instructions were provided to the                            patient.                           - Continue present medications.                           - Repeat colonoscopy in 10 years for screening                            purposes.                           - Cause of soft stools not fond.                           Consider trying FODMAPS elimination  [employer] IBS testing another option                           Eliminate any artificial sweeteners Kenney Peacemaker, MD 02/20/2024 8:35:28 AM This report has been signed electronically.

## 2024-02-20 NOTE — Progress Notes (Signed)
 History and Physical Interval Note:  02/20/2024 7:56 AM  Gabrielle Hernandez  has presented today for endoscopic procedure(s), with the diagnosis of  Encounter Diagnosis  Name Primary?   Change in bowel habits Yes  .  The various methods of evaluation and treatment have been discussed with the patient and/or family. After consideration of risks, benefits and other options for treatment, the patient has consented to  the endoscopic procedure(s).   The patient's history has been reviewed, patient examined, no change in status, stable for endoscopic procedure(s).  I have reviewed the patient's chart and labs.  Questions were answered to the patient's satisfaction.     Kenney Peacemaker, MD, [facility]

## 2024-02-20 NOTE — Progress Notes (Signed)
 Pt's states no medical or surgical changes since previsit or office visit.

## 2024-02-20 NOTE — Patient Instructions (Addendum)
 You have mild diverticulosis but all else looks ok. No cause for change in bowel habits seen.  [employer] I[employer] is a company that can do food sensitivity testing if interested. Can find them on line.  FODMAPS diet is something else people try.  Another consideration would be eliminating any artificial sweeteners.  Next routine colonoscopy or other screening test in 10 years - 2035.  I appreciate the opportunity to care for you. Kenney Peacemaker, MD, [facility]  Handouts provided on diverticulosis and FODMAPS.  Resume previous diet.  Continue present medications.    YOU HAD AN ENDOSCOPIC PROCEDURE TODAY AT THE [facility]:   Refer to the procedure report that was given to you for any specific questions about what was found during the examination.  If the procedure report does not answer your questions, please call your gastroenterologist to clarify.  If you requested that your care partner not be given the details of your procedure findings, then the procedure report has been included in a sealed envelope for you to review at your convenience later.  YOU SHOULD EXPECT: Some feelings of bloating in the abdomen. Passage of more gas than usual.  Walking can help get rid of the air that was put into your GI tract during the procedure and reduce the bloating. If you had a lower endoscopy (such as a colonoscopy or flexible sigmoidoscopy) you may notice spotting of blood in your stool or on the toilet paper. If you underwent a bowel prep for your procedure, you may not have a normal bowel movement for a few days.  Please Note:  You might notice some irritation and congestion in your nose or some drainage.  This is from the oxygen used during your procedure.  There is no need for concern and it should clear up in a day or so.  SYMPTOMS TO REPORT IMMEDIATELY:  Following lower endoscopy (colonoscopy or flexible sigmoidoscopy):  Excessive amounts of blood in the stool  Significant tenderness or  worsening of abdominal pains  Swelling of the abdomen that is new, acute  Fever of 100F or higher  For urgent or emergent issues, a gastroenterologist can be reached at any hour by calling [phone]. Do not use MyChart messaging for urgent concerns.    DIET:  We do recommend a small meal at first, but then you may proceed to your regular diet.  Drink plenty of fluids but you should avoid alcoholic beverages for 24 hours.  ACTIVITY:  You should plan to take it easy for the rest of today and you should NOT DRIVE or use heavy machinery until tomorrow (because of the sedation medicines used during the test).    FOLLOW UP: Our staff will call the number listed on your records the next business day following your procedure.  We will call around 7:15- 8:00 am to check on you and address any questions or concerns that you may have regarding the information given to you following your procedure. If we do not reach you, we will leave a message.     If any biopsies were taken you will be contacted by phone or by letter within the next 1-3 weeks.  Please call us  at [phone] if you have not heard about the biopsies in 3 weeks.    SIGNATURES/CONFIDENTIALITY: You and/or your care partner have signed paperwork which will be entered into your electronic medical record.  These signatures attest to the fact that that the information above on your After Visit  Summary has been reviewed and is understood.  Full responsibility of the confidentiality of this discharge information lies with you and/or your care-partner.

## 2024-02-21 ENCOUNTER — Telehealth: Payer: Self-pay | Admitting: Lactation Services

## 2024-02-21 NOTE — Telephone Encounter (Signed)
  Follow up Call-     02/20/2024    7:21 AM  Call back number  Post procedure Call Back phone  # [phone]  Permission to leave phone message Yes     Patient questions:  Do you have a fever, pain , or abdominal swelling? No. Pain Score  0 *  Have you tolerated food without any problems? Yes.    Have you been able to return to your normal activities? Yes.    Do you have any questions about your discharge instructions: Diet   No. Medications  No. Follow up visit  No.  Do you have questions or concerns about your Care? No.  Actions: * If pain score is 4 or above: No action needed, pain <4.

## 2024-03-12 DIAGNOSIS — H2513 Age-related nuclear cataract, bilateral: Secondary | ICD-10-CM | POA: Diagnosis not present

## 2024-03-12 DIAGNOSIS — H353112 Nonexudative age-related macular degeneration, right eye, intermediate dry stage: Secondary | ICD-10-CM | POA: Diagnosis not present

## 2024-03-12 DIAGNOSIS — H353123 Nonexudative age-related macular degeneration, left eye, advanced atrophic without subfoveal involvement: Secondary | ICD-10-CM | POA: Diagnosis not present

## 2024-03-16 ENCOUNTER — Other Ambulatory Visit (HOSPITAL_COMMUNITY): Payer: Self-pay

## 2024-03-16 DIAGNOSIS — L821 Other seborrheic keratosis: Secondary | ICD-10-CM | POA: Diagnosis not present

## 2024-03-16 DIAGNOSIS — D485 Neoplasm of uncertain behavior of skin: Secondary | ICD-10-CM | POA: Diagnosis not present

## 2024-03-16 DIAGNOSIS — D225 Melanocytic nevi of trunk: Secondary | ICD-10-CM | POA: Diagnosis not present

## 2024-03-16 DIAGNOSIS — L400 Psoriasis vulgaris: Secondary | ICD-10-CM | POA: Diagnosis not present

## 2024-03-16 MED ORDER — CLOBETASOL PROPIONATE 0.05 % EX SHAM
MEDICATED_SHAMPOO | CUTANEOUS | 2 refills | Status: AC
  Filled 2024-03-16: qty 118, 30d supply, fill #0

## 2024-03-16 MED ORDER — FLUOCINONIDE 0.05 % EX SOLN
CUTANEOUS | 2 refills | Status: AC
  Filled 2024-03-16: qty 60, 30d supply, fill #0

## 2024-04-29 DIAGNOSIS — Z6828 Body mass index (BMI) 28.0-28.9, adult: Secondary | ICD-10-CM | POA: Diagnosis not present

## 2024-04-29 DIAGNOSIS — Z79890 Hormone replacement therapy (postmenopausal): Secondary | ICD-10-CM | POA: Diagnosis not present

## 2024-04-29 DIAGNOSIS — Z1330 Encounter for screening examination for mental health and behavioral disorders, unspecified: Secondary | ICD-10-CM | POA: Diagnosis not present

## 2024-04-29 DIAGNOSIS — Z1231 Encounter for screening mammogram for malignant neoplasm of breast: Secondary | ICD-10-CM | POA: Diagnosis not present

## 2024-04-29 DIAGNOSIS — Z1211 Encounter for screening for malignant neoplasm of colon: Secondary | ICD-10-CM | POA: Diagnosis not present

## 2024-04-29 DIAGNOSIS — N951 Menopausal and female climacteric states: Secondary | ICD-10-CM | POA: Diagnosis not present

## 2024-04-29 DIAGNOSIS — Z01419 Encounter for gynecological examination (general) (routine) without abnormal findings: Secondary | ICD-10-CM | POA: Diagnosis not present

## 2024-05-01 ENCOUNTER — Other Ambulatory Visit (HOSPITAL_COMMUNITY): Payer: Self-pay

## 2024-05-01 ENCOUNTER — Other Ambulatory Visit: Payer: Self-pay

## 2024-05-07 ENCOUNTER — Other Ambulatory Visit (HOSPITAL_COMMUNITY): Payer: Self-pay

## 2024-05-07 MED ORDER — ESTRADIOL 0.1 MG/GM VA CREA
2.0000 g | TOPICAL_CREAM | VAGINAL | 11 refills | Status: DC
  Filled 2024-05-07: qty 42.5, 90d supply, fill #0

## 2024-05-08 ENCOUNTER — Other Ambulatory Visit (HOSPITAL_COMMUNITY): Payer: Self-pay

## 2024-05-29 DIAGNOSIS — M542 Cervicalgia: Secondary | ICD-10-CM | POA: Diagnosis not present

## 2024-08-19 ENCOUNTER — Other Ambulatory Visit (HOSPITAL_COMMUNITY): Payer: Self-pay

## 2024-08-27 ENCOUNTER — Other Ambulatory Visit (HOSPITAL_COMMUNITY): Payer: Self-pay

## 2024-08-27 ENCOUNTER — Encounter: Payer: Self-pay | Admitting: Physician Assistant

## 2024-08-27 ENCOUNTER — Ambulatory Visit: Admitting: Physician Assistant

## 2024-08-27 ENCOUNTER — Other Ambulatory Visit: Payer: Self-pay

## 2024-08-27 VITALS — BP 130/80 | HR 74 | Temp 97.7°F | Ht 64.0 in | Wt 164.0 lb

## 2024-08-27 DIAGNOSIS — E663 Overweight: Secondary | ICD-10-CM

## 2024-08-27 DIAGNOSIS — K219 Gastro-esophageal reflux disease without esophagitis: Secondary | ICD-10-CM | POA: Diagnosis not present

## 2024-08-27 DIAGNOSIS — H3530 Unspecified macular degeneration: Secondary | ICD-10-CM | POA: Diagnosis not present

## 2024-08-27 DIAGNOSIS — E559 Vitamin D deficiency, unspecified: Secondary | ICD-10-CM | POA: Diagnosis not present

## 2024-08-27 DIAGNOSIS — E785 Hyperlipidemia, unspecified: Secondary | ICD-10-CM

## 2024-08-27 DIAGNOSIS — E88819 Insulin resistance, unspecified: Secondary | ICD-10-CM

## 2024-08-27 DIAGNOSIS — Z0000 Encounter for general adult medical examination without abnormal findings: Secondary | ICD-10-CM

## 2024-08-27 LAB — COMPREHENSIVE METABOLIC PANEL WITH GFR
ALT: 16 U/L (ref 0–35)
AST: 22 U/L (ref 0–37)
Albumin: 4.4 g/dL (ref 3.5–5.2)
Alkaline Phosphatase: 77 U/L (ref 39–117)
BUN: 16 mg/dL (ref 6–23)
CO2: 29 meq/L (ref 19–32)
Calcium: 9.4 mg/dL (ref 8.4–10.5)
Chloride: 102 meq/L (ref 96–112)
Creatinine, Ser: 0.79 mg/dL (ref 0.40–1.20)
GFR: 80.67 mL/min (ref >60.00)
Glucose, Bld: 95 mg/dL (ref 70–99)
Potassium: 4.4 meq/L (ref 3.5–5.1)
Sodium: 138 meq/L (ref 135–145)
Total Bilirubin: 0.5 mg/dL (ref 0.2–1.2)
Total Protein: 7.3 g/dL (ref 6.0–8.3)

## 2024-08-27 LAB — CBC WITH DIFFERENTIAL/PLATELET
Basophils Absolute: 0.1 K/uL (ref 0.0–0.1)
Basophils Relative: 1 % (ref 0.0–3.0)
Eosinophils Absolute: 0.2 K/uL (ref 0.0–0.7)
Eosinophils Relative: 2.5 % (ref 0.0–5.0)
HCT: 40.9 % (ref 36.0–46.0)
Hemoglobin: 13.3 g/dL (ref 12.0–15.0)
Lymphocytes Relative: 24.4 % (ref 12.0–46.0)
Lymphs Abs: 1.6 K/uL (ref 0.7–4.0)
MCHC: 32.6 g/dL (ref 30.0–36.0)
MCV: 84.7 fl (ref 78.0–100.0)
Monocytes Absolute: 0.6 K/uL (ref 0.1–1.0)
Monocytes Relative: 8.5 % (ref 3.0–12.0)
Neutro Abs: 4.2 K/uL (ref 1.4–7.7)
Neutrophils Relative %: 63.6 % (ref 43.0–77.0)
Platelets: 277 K/uL (ref 150.0–400.0)
RBC: 4.83 Mil/uL (ref 3.87–5.11)
RDW: 14.3 % (ref 11.5–15.5)
WBC: 6.5 K/uL (ref 4.0–10.5)

## 2024-08-27 LAB — LIPID PANEL
Cholesterol: 198 mg/dL (ref 0–200)
HDL: 54.5 mg/dL (ref >39.00)
LDL Cholesterol: 124 mg/dL — ABNORMAL HIGH (ref 0–99)
NonHDL: 143.59
Total CHOL/HDL Ratio: 4
Triglycerides: 96 mg/dL (ref 0.0–149.0)
VLDL: 19.2 mg/dL (ref 0.0–40.0)

## 2024-08-27 LAB — CORTISOL: Cortisol, Plasma: 4.7 ug/dL

## 2024-08-27 LAB — VITAMIN D 25 HYDROXY (VIT D DEFICIENCY, FRACTURES): VITD: 71.16 ng/mL (ref 30.00–100.00)

## 2024-08-27 LAB — HEMOGLOBIN A1C: Hgb A1c MFr Bld: 5.8 % (ref 4.6–6.5)

## 2024-08-27 MED ORDER — ESTRADIOL 0.01 % VA CREA
2.0000 g | TOPICAL_CREAM | VAGINAL | 10 refills | Status: AC
Start: 2024-05-07 — End: ?
  Filled 2024-08-27: qty 42.5, 90d supply, fill #0

## 2024-08-27 MED ORDER — PANTOPRAZOLE SODIUM 40 MG PO TBEC
40.0000 mg | DELAYED_RELEASE_TABLET | Freq: Every day | ORAL | 1 refills | Status: AC
  Filled 2024-08-27: qty 90, 90d supply, fill #0

## 2024-08-27 MED ORDER — LORAZEPAM 0.5 MG PO TABS
0.5000 mg | ORAL_TABLET | Freq: Two times a day (BID) | ORAL | 1 refills | Status: AC | PRN
  Filled 2024-08-27: qty 30, 15d supply, fill #0

## 2024-08-27 NOTE — Progress Notes (Signed)
 Subjective:    Gabrielle Hernandez is a 63 y.o. female and is here for a comprehensive physical exam.  HPI  There are no preventive care reminders to display for this patient.   Discussed the use of AI scribe software for clinical note transcription with the patient, who gave verbal consent to proceed.  History of Present Illness   Gabrielle Hernandez is a 63 year old female who presents for a follow-up visit.  She experiences neck pain and numbness radiating down her arm, which she attributes to weightlifting. Acupuncture has been beneficial, but her physical therapist, who performed dry needling, has left. She is considering seeing another therapist for similar treatment. She has paused weightlifting due to shoulder pain and experiences muscle knots in her shoulders, attributed to prolonged computer use and poor posture.  She has gastrointestinal issues, including loose stools, but not diarrhea. A colonoscopy was normal. She tried Benefiber without improvement. She takes famotidine  twice daily and Tums as needed for reflux, which has worsened recently. Pantoprazole  was more effective in the past.  She experiences occasional dizziness, believed to be related to her inner ear, sometimes triggered by eye movements. No recent falls, but she has experienced lightheadedness in the past. She reports sinus pressure, particularly in the morning, which improves after taking Sudafed.  She has been experiencing upper respiratory symptoms following a recent trip, with lingering effects. Sinus pressure and fluid in her ears improve with Sudafed.        Health Maintenance: Immunizations -- declined shots Colonoscopy -- UpToDate  Mammogram -- UpToDate  PAP -- UpToDate  Bone Density -- N/A  Diet -- overall healthy Exercise -- regular  Sleep habits -- overall doing ok  Mood -- overall stable  UTD with dentist? - yes UTD with eye doctor? - yes  Weight history: Wt Readings from Last  10 Encounters:  08/27/24 164 lb (74.4 kg)  02/20/24 163 lb (73.9 kg)  01/29/24 163 lb (73.9 kg)  10/30/23 168 lb 3.2 oz (76.3 kg)  06/05/23 160 lb 4 oz (72.7 kg)  04/22/23 161 lb (73 kg)  01/22/22 160 lb (72.6 kg)  12/07/21 160 lb (72.6 kg)  11/18/20 153 lb (69.4 kg)  10/16/19 146 lb (66.2 kg)   Body mass index is 28.15 kg/m. Patient's last menstrual period was 10/22/2012.  Alcohol use:  reports current alcohol use of about 4.0 standard drinks of alcohol per week.  Tobacco use:  Tobacco Use: Medium Risk (08/27/2024)   Patient History    Smoking Tobacco Use: Former    Smokeless Tobacco Use: Never    Passive Exposure: Not on file   Eligible for lung cancer screening? no     08/27/2024    9:07 AM  Depression screen PHQ 2/9  Decreased Interest 0  Down, Depressed, Hopeless 0  PHQ - 2 Score 0     Other providers/specialists: Patient Care Team: Job Lukes, [place] as PCP - General (Physician Assistant) Faythe Purchase, MD as Consulting Physician (Endocrinology)    PMHx, SurgHx, SocialHx, Medications, and Allergies were reviewed in the Visit Navigator and updated as appropriate.   Past Medical History:  Diagnosis Date   Allergy    Anxiety July 2024   Gallstones 1993   GERD (gastroesophageal reflux disease)    Macular degeneration    Multinodular goiter 2009   Right thyroid  nodule. Negative FNA. Followed by Endocrine.   Seasonal allergies    Superficial basal cell carcinoma on right shoulder 2011  Past Surgical History:  Procedure Laterality Date   CHOLECYSTECTOMY  10/09/1991   DILATION AND CURETTAGE OF UTERUS     ESOPHAGOGASTRODUODENOSCOPY     THYROID  SURGERY  04/07/2010   thyroid  biopsy   TUBAL LIGATION  10/08/1994     Family History  Problem Relation Age of Onset   Hypertension Mother    Diabetes Mother        well-controlled   Depression Father    Hypertension Brother    Heart disease Maternal Grandfather    Parkinson's disease Maternal  Grandfather    Coronary artery disease Maternal Grandfather    Stomach cancer Paternal Grandmother    Colon cancer Neg Hx    Esophageal cancer Neg Hx    Colon polyps Neg Hx     Social History   Tobacco Use   Smoking status: Former    Average packs/day: 0.1 packs/day for 3.0 years (0.3 ttl pk-yrs)    Types: Cigarettes    Start date: 11/13/1989   Smokeless tobacco: Never  Vaping Use   Vaping status: Never Used  Substance Use Topics   Alcohol use: Yes    Alcohol/week: 4.0 standard drinks of alcohol    Types: 4 Glasses of wine per week    Comment: 1 glass of wine a night on weekends   Drug use: Never    Review of Systems:   Review of Systems  Constitutional:  Negative for chills, fever, malaise/fatigue and weight loss.  HENT:  Negative for hearing loss, sinus pain and sore throat.   Respiratory:  Negative for cough and hemoptysis.   Cardiovascular:  Negative for chest pain, palpitations, leg swelling and PND.  Gastrointestinal:  Negative for abdominal pain, constipation, diarrhea, heartburn, nausea and vomiting.  Genitourinary:  Negative for dysuria, frequency and urgency.  Musculoskeletal:  Negative for back pain, myalgias and neck pain.  Skin:  Negative for itching and rash.  Neurological:  Negative for dizziness, tingling, seizures and headaches.  Endo/Heme/Allergies:  Negative for polydipsia.  Psychiatric/Behavioral:  Negative for depression. The patient is not nervous/anxious.     Objective:   BP 130/80 (BP Location: Left Arm, Patient Position: Sitting, Cuff Size: Normal)   Pulse 74   Temp 97.7 F (36.5 C) (Temporal)   Ht 5' 4 (1.626 m)   Wt 164 lb (74.4 kg)   LMP 10/22/2012 Comment: BTL, sexually active  SpO2 99%   BMI 28.15 kg/m  Body mass index is 28.15 kg/m.   General Appearance:    Alert, cooperative, no distress, appears stated age  Head:    Normocephalic, without obvious abnormality, atraumatic  Eyes:    PERRL, conjunctiva/corneas clear, EOM's  intact, fundi    benign, both eyes  Ears:    Normal TM's and external ear canals, both ears  Nose:   Nares normal, septum midline, mucosa normal, no drainage    or sinus tenderness  Throat:   Lips, mucosa, and tongue normal; teeth and gums normal  Neck:   Supple, symmetrical, trachea midline, no adenopathy;    thyroid :  no enlargement/tenderness/nodules; no carotid   bruit or JVD  Back:     Symmetric, no curvature, ROM normal, no CVA tenderness  Lungs:     Clear to auscultation bilaterally, respirations unlabored  Chest Wall:    No tenderness or deformity   Heart:    Regular rate and rhythm, S1 and S2 normal, no murmur, rub or gallop  Breast Exam:    Deferred  Abdomen:     Soft,  non-tender, bowel sounds active all four quadrants,    no masses, no organomegaly  Genitalia:    Deferred  Extremities:   Extremities normal, atraumatic, no cyanosis or edema  Pulses:   2+ and symmetric all extremities  Skin:   Skin color, texture, turgor normal, no rashes or lesions  Lymph nodes:   Cervical, supraclavicular, and axillary nodes normal  Neurologic:   CNII-XII intact, normal strength, sensation and reflexes    throughout    Assessment/Plan:   Assessment and Plan    Adult Wellness Visit Routine wellness visit with no acute concerns. Discussed general health maintenance, including screenings and lifestyle modifications. - Continue routine health maintenance and screenings as per guidelines.  Insulin resistance Concern for insulin resistance. Requested evaluation of A1c and insulin levels. - Ordered A1c and insulin levels.  Hyperlipidemia - Ordered lipid panel.  Overweight Discussed importance of weight management and exercise. She paused weightlifting due to shoulder pain. - Encouraged resumption of weightlifting as tolerated. - Discussed dietary modifications and exercise regimen.  Vitamin D  deficiency Currently taking 4000 IU of vitamin D  daily with food. Plan to check vitamin D   levels to ensure adequate absorption. - Ordered vitamin D  level.  Gastroesophageal reflux disease Reflux symptoms have worsened. Previously managed with pantoprazole  40 mg once daily, which was effective. - Prescribed pantoprazole  40 mg once daily.  Early macular degeneration Under care of a retinal specialist. Recent concern for geographic atrophy, follow-up scheduled for December. - Continue follow-up with retinal specialist in December.   Lucie Buttner, PA-C [facility]

## 2024-08-28 ENCOUNTER — Ambulatory Visit: Payer: Self-pay | Admitting: Physician Assistant

## 2024-09-02 LAB — INSULIN, FREE (BIOACTIVE): Insulin, Free: 5.5 u[IU]/mL (ref 1.5–14.9)

## 2024-09-17 DIAGNOSIS — H2513 Age-related nuclear cataract, bilateral: Secondary | ICD-10-CM | POA: Diagnosis not present

## 2024-09-17 DIAGNOSIS — H353112 Nonexudative age-related macular degeneration, right eye, intermediate dry stage: Secondary | ICD-10-CM | POA: Diagnosis not present

## 2024-09-17 DIAGNOSIS — H353123 Nonexudative age-related macular degeneration, left eye, advanced atrophic without subfoveal involvement: Secondary | ICD-10-CM | POA: Diagnosis not present

## 2024-10-19 ENCOUNTER — Encounter: Admitting: Physician Assistant
